# Patient Record
Sex: Female | Born: 1965 | Race: White | Hispanic: No | Marital: Married | Smoking: Current every day smoker
Health system: Southern US, Community
[De-identification: ages and names within clinical notes are randomized; demographics above are authoritative.]

## PROBLEM LIST (undated history)

## (undated) DIAGNOSIS — S82852A Displaced trimalleolar fracture of left lower leg, initial encounter for closed fracture: Secondary | ICD-10-CM

## (undated) DIAGNOSIS — F1120 Opioid dependence, uncomplicated: Secondary | ICD-10-CM

## (undated) DIAGNOSIS — M549 Dorsalgia, unspecified: Secondary | ICD-10-CM

## (undated) HISTORY — PX: SHOULDER SURGERY: SHX246

---

## 1997-08-07 ENCOUNTER — Inpatient Hospital Stay (HOSPITAL_COMMUNITY): Admission: AD | Admit: 1997-08-07 | Discharge: 1997-08-07 | Payer: Self-pay | Admitting: *Deleted

## 1997-09-24 ENCOUNTER — Inpatient Hospital Stay (HOSPITAL_COMMUNITY): Admission: AD | Admit: 1997-09-24 | Discharge: 1997-09-24 | Payer: Self-pay | Admitting: Obstetrics and Gynecology

## 1997-09-29 ENCOUNTER — Inpatient Hospital Stay (HOSPITAL_COMMUNITY): Admission: AD | Admit: 1997-09-29 | Discharge: 1997-09-29 | Payer: Self-pay | Admitting: Obstetrics and Gynecology

## 1997-10-30 ENCOUNTER — Inpatient Hospital Stay (HOSPITAL_COMMUNITY): Admission: AD | Admit: 1997-10-30 | Discharge: 1997-11-03 | Payer: Self-pay | Admitting: Obstetrics and Gynecology

## 1997-11-03 ENCOUNTER — Encounter (HOSPITAL_COMMUNITY): Admission: RE | Admit: 1997-11-03 | Discharge: 1998-01-18 | Payer: Self-pay | Admitting: *Deleted

## 1997-12-07 ENCOUNTER — Other Ambulatory Visit: Admission: RE | Admit: 1997-12-07 | Discharge: 1997-12-07 | Payer: Self-pay | Admitting: *Deleted

## 1998-01-02 ENCOUNTER — Encounter: Payer: Self-pay | Admitting: Orthopedic Surgery

## 1998-01-02 ENCOUNTER — Ambulatory Visit (HOSPITAL_COMMUNITY): Admission: RE | Admit: 1998-01-02 | Discharge: 1998-01-02 | Payer: Self-pay | Admitting: Orthopedic Surgery

## 1998-03-12 ENCOUNTER — Encounter: Payer: Self-pay | Admitting: Orthopedic Surgery

## 1998-03-12 ENCOUNTER — Ambulatory Visit (HOSPITAL_COMMUNITY): Admission: RE | Admit: 1998-03-12 | Discharge: 1998-03-12 | Payer: Self-pay | Admitting: Orthopedic Surgery

## 1998-07-04 ENCOUNTER — Encounter: Admission: RE | Admit: 1998-07-04 | Discharge: 1998-10-02 | Payer: Self-pay | Admitting: Anesthesiology

## 1998-07-09 ENCOUNTER — Encounter: Payer: Self-pay | Admitting: Anesthesiology

## 1998-08-07 ENCOUNTER — Encounter: Payer: Self-pay | Admitting: Orthopedic Surgery

## 1998-08-07 ENCOUNTER — Ambulatory Visit (HOSPITAL_COMMUNITY): Admission: RE | Admit: 1998-08-07 | Discharge: 1998-08-07 | Payer: Self-pay | Admitting: Orthopedic Surgery

## 1998-10-03 ENCOUNTER — Encounter: Admission: RE | Admit: 1998-10-03 | Discharge: 1998-12-13 | Payer: Self-pay | Admitting: Anesthesiology

## 1998-12-12 ENCOUNTER — Other Ambulatory Visit: Admission: RE | Admit: 1998-12-12 | Discharge: 1998-12-12 | Payer: Self-pay | Admitting: *Deleted

## 1998-12-13 ENCOUNTER — Encounter: Admission: RE | Admit: 1998-12-13 | Discharge: 1999-03-13 | Payer: Self-pay | Admitting: Anesthesiology

## 1999-04-09 ENCOUNTER — Encounter: Admission: RE | Admit: 1999-04-09 | Discharge: 1999-06-30 | Payer: Self-pay | Admitting: Anesthesiology

## 1999-08-11 ENCOUNTER — Encounter: Admission: RE | Admit: 1999-08-11 | Discharge: 1999-11-09 | Payer: Self-pay | Admitting: Anesthesiology

## 1999-11-20 ENCOUNTER — Encounter: Admission: RE | Admit: 1999-11-20 | Discharge: 2000-02-18 | Payer: Self-pay | Admitting: Anesthesiology

## 2000-03-01 ENCOUNTER — Encounter: Admission: RE | Admit: 2000-03-01 | Discharge: 2000-05-30 | Payer: Self-pay | Admitting: Anesthesiology

## 2000-03-29 ENCOUNTER — Ambulatory Visit (HOSPITAL_COMMUNITY): Admission: RE | Admit: 2000-03-29 | Discharge: 2000-03-29 | Payer: Self-pay | Admitting: Family Medicine

## 2000-03-29 ENCOUNTER — Encounter: Payer: Self-pay | Admitting: Family Medicine

## 2000-06-11 ENCOUNTER — Encounter: Admission: RE | Admit: 2000-06-11 | Discharge: 2000-09-09 | Payer: Self-pay | Admitting: Anesthesiology

## 2000-09-16 ENCOUNTER — Ambulatory Visit (HOSPITAL_COMMUNITY): Admission: RE | Admit: 2000-09-16 | Discharge: 2000-09-16 | Payer: Self-pay | Admitting: Anesthesiology

## 2000-09-16 ENCOUNTER — Encounter: Payer: Self-pay | Admitting: Anesthesiology

## 2000-09-27 ENCOUNTER — Encounter: Admission: RE | Admit: 2000-09-27 | Discharge: 2000-10-09 | Payer: Self-pay | Admitting: Anesthesiology

## 2001-01-28 ENCOUNTER — Ambulatory Visit (HOSPITAL_COMMUNITY): Admission: RE | Admit: 2001-01-28 | Discharge: 2001-01-28 | Payer: Self-pay | Admitting: Anesthesiology

## 2001-01-28 ENCOUNTER — Encounter: Payer: Self-pay | Admitting: Anesthesiology

## 2001-02-17 ENCOUNTER — Other Ambulatory Visit: Admission: RE | Admit: 2001-02-17 | Discharge: 2001-02-17 | Payer: Self-pay | Admitting: Obstetrics and Gynecology

## 2001-05-30 ENCOUNTER — Encounter: Admission: RE | Admit: 2001-05-30 | Discharge: 2001-05-30 | Payer: Self-pay | Admitting: Sports Medicine

## 2001-05-30 ENCOUNTER — Encounter: Payer: Self-pay | Admitting: Sports Medicine

## 2002-03-09 ENCOUNTER — Encounter: Payer: Self-pay | Admitting: Family Medicine

## 2002-03-09 ENCOUNTER — Encounter: Admission: RE | Admit: 2002-03-09 | Discharge: 2002-03-09 | Payer: Self-pay | Admitting: Family Medicine

## 2002-05-02 ENCOUNTER — Other Ambulatory Visit: Admission: RE | Admit: 2002-05-02 | Discharge: 2002-05-02 | Payer: Self-pay | Admitting: Obstetrics and Gynecology

## 2003-05-16 ENCOUNTER — Other Ambulatory Visit: Admission: RE | Admit: 2003-05-16 | Discharge: 2003-05-16 | Payer: Self-pay | Admitting: Obstetrics and Gynecology

## 2004-04-29 ENCOUNTER — Encounter: Admission: RE | Admit: 2004-04-29 | Discharge: 2004-04-29 | Payer: Self-pay | Admitting: Family Medicine

## 2004-05-27 ENCOUNTER — Other Ambulatory Visit: Admission: RE | Admit: 2004-05-27 | Discharge: 2004-05-27 | Payer: Self-pay | Admitting: *Deleted

## 2007-01-21 ENCOUNTER — Other Ambulatory Visit: Admission: RE | Admit: 2007-01-21 | Discharge: 2007-01-21 | Payer: Self-pay | Admitting: Obstetrics & Gynecology

## 2007-11-30 ENCOUNTER — Ambulatory Visit (HOSPITAL_BASED_OUTPATIENT_CLINIC_OR_DEPARTMENT_OTHER): Admission: RE | Admit: 2007-11-30 | Discharge: 2007-12-01 | Payer: Self-pay | Admitting: Orthopedic Surgery

## 2007-11-30 HISTORY — PX: ANTERIOR CRUCIATE LIGAMENT REPAIR: SHX115

## 2008-03-30 ENCOUNTER — Encounter: Admission: RE | Admit: 2008-03-30 | Discharge: 2008-03-30 | Payer: Self-pay | Admitting: Family Medicine

## 2008-05-25 ENCOUNTER — Emergency Department (HOSPITAL_COMMUNITY): Admission: EM | Admit: 2008-05-25 | Discharge: 2008-05-26 | Payer: Self-pay | Admitting: Emergency Medicine

## 2008-06-06 ENCOUNTER — Encounter: Admission: RE | Admit: 2008-06-06 | Discharge: 2008-06-06 | Payer: Self-pay | Admitting: Gastroenterology

## 2010-05-21 LAB — COMPREHENSIVE METABOLIC PANEL
Albumin: 3.4 g/dL — ABNORMAL LOW (ref 3.5–5.2)
BUN: 9 mg/dL (ref 6–23)
Calcium: 8.6 mg/dL (ref 8.4–10.5)
Creatinine, Ser: 0.77 mg/dL (ref 0.4–1.2)
Potassium: 3.9 mEq/L (ref 3.5–5.1)
Total Protein: 6.7 g/dL (ref 6.0–8.3)

## 2010-05-21 LAB — CBC
HCT: 39.1 % (ref 36.0–46.0)
MCHC: 35 g/dL (ref 30.0–36.0)
MCV: 92.8 fL (ref 78.0–100.0)
Platelets: 134 10*3/uL — ABNORMAL LOW (ref 150–400)
RDW: 13.1 % (ref 11.5–15.5)

## 2010-05-21 LAB — DIFFERENTIAL
Lymphocytes Relative: 9 % — ABNORMAL LOW (ref 12–46)
Lymphs Abs: 0.7 10*3/uL (ref 0.7–4.0)
Monocytes Absolute: 0.3 10*3/uL (ref 0.1–1.0)
Monocytes Relative: 4 % (ref 3–12)
Neutro Abs: 6.2 10*3/uL (ref 1.7–7.7)
Neutrophils Relative %: 86 % — ABNORMAL HIGH (ref 43–77)

## 2010-05-21 LAB — URINALYSIS, ROUTINE W REFLEX MICROSCOPIC
Nitrite: NEGATIVE
Specific Gravity, Urine: 1.04 — ABNORMAL HIGH (ref 1.005–1.030)
pH: 5.5 (ref 5.0–8.0)

## 2010-05-21 LAB — URINE MICROSCOPIC-ADD ON

## 2010-05-21 LAB — POCT PREGNANCY, URINE: Preg Test, Ur: NEGATIVE

## 2010-06-24 NOTE — Op Note (Signed)
NAMESEELEY, SOUTHGATE               ACCOUNT NO.:  [id number]   MEDICAL RECORD NO.:  [id number]          PATIENT TYPE:  AMB   LOCATION:  DSC                          FACILITY:  [facility]   PHYSICIAN:  Feliberto Gottron. Turner Daniels, M.D.   DATE OF BIRTH:  04-02-1965   DATE OF PROCEDURE:  11/30/2007  DATE OF DISCHARGE:                               OPERATIVE REPORT   PREOPERATIVE DIAGNOSIS:  Left knee anterior cruciate ligament tear.   POSTOPERATIVE DIAGNOSES:  Left knee anterior cruciate ligament tear with  the addition of some interim tearing of the lateral meniscus.   PROCEDURE:  Left knee partial arthroscopic lateral meniscectomy followed  by allograft anterior cruciate ligament reconstruction using an 11-mm  wide bone tendon bone allograft 8 x 20 femoral fixation screw 9 x 20  tibial fixation screw, both were Linvatec propel titanium designs.   SURGEON:  Feliberto Gottron. Turner Daniels, MD   FIRST ASSISTANT:  Shirl Harris, PA-C   ANESTHETIC:  General LMA plus femoral nerve block.   ESTIMATED BLOOD LOSS:  Minimal fluid replacement.   CRYSTALLOID:  1100 mL.   DRAINS:  Placed none.   TOURNIQUET TIME:  None.   INDICATIONS FOR PROCEDURE:  The patient is a 45 year old woman who was  dancing at [employer] party a couple of days ago, fell with a loud pop in  her knee.  When we saw her in the office 2 days ago, she had a  hemarthrosis, a small fleck of bone in the notch, positive Lachman's  test, positive pivot shift test, and it was felt that she probably  sustained an ACL tear at age 48.  Based on these findings, risks and  benefits of surgical reconstruction of the ACL were discussed with this  very active 45 year old woman who has some young children and she is  elected to undergo allograft ACL reconstruction.  Risks and benefits of  surgery discussed, questions were answered.   DESCRIPTION OF PROCEDURE:  The patient was identified by armband and  underwent left femoral nerve block anesthetic at [facility]  and then was transported to the operating room one where the appropriate  anesthetic monitors were reattached and general LMA anesthesia induced  with the patient in supine position.  Tourniquet applied high in the  left thigh.  Left lower extremity prepped and draped in sterile fashion  from the ankle to the tourniquet.  The tourniquet was never used.  She  did receive IV antibiotics preoperatively and at this point a standard  time out procedure was performed.  Upon the time out, we made standard  inferomedial and inferolateral parapatellar portals with a #11 blade  allowing introduction of the arthroscope through the inferolateral  portal and outflow through the inferomedial portal.  Pump pressure was  kept between about 80 mm for the beginning of the case and down to about  45-50 mm towards the end of the case.  Diagnostic arthroscopy confirmed  the hemarthrosis.  Normal trochlea patella and medial compartment moving  into the notch.  The ACL was noted to be completely torn off its femoral  origin and this was debrided back to the ends of the stumps proximally  and distally.  Moving to the lateral side, the patient had some minor  degenerative tearing of the lateral meniscus and this was debrided back  to stable margin as well.  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  At this point, the knee was placed in the Stulberg positioner at  45 degrees flexion.  The standard superolateral notch plasty performed  with the 4.5-hooded vortex bur.  Satisfied with the notch plasty, the  back table was then prepared and a 11-mm wide bone tendon bone allograft  with a 10-mm patellar bone plugged in an 11-mm tibial bone plug and a  single 20-gauge stand steel wire placed to a hole in the tibial bone  plug for traction.  At this point, the tibial 10 guide was placed at  about 58 degrees, inserted through the  inferomedial peripatellar portal  allowing [place] to check the position of the incision for the allograft  insertion and at this point, a 1.5 cm longitudinal incision was made, a  1.5 cm medial to the tibial tubercle.  This allowed placement of a guide  10 up through the pin positioning guide to the posterior aspect of the  ACL footprints and this was then over reamed within 11 mm badger reamer.  Utilizing the same tunnel, the 7 mm over-the-top guide was placed at the  1:30 position and a pin placed in the femoral notch in this position and  this was over reamed with a 10-mm badger reamer to a depth of about 32  mm.  A superior notch was then placed in the femoral tunnel and a  lateral notch in the tibial tunnel of the notch cutting guides.  At this  point, the allograft was then threaded up through the tibial tunnel into  the femoral tunnel using guide tends to push the allograft up in the  proper position and this was confirmed by arthroscopic imaging.  The  knee was then hyperflexed about 100 degrees and using a supplemental  inferomedial portal and Nitinol wire placed up into the notch between  the allograft in the femoral tunnel and then the allograft bone plug was  fixed with an 8 x 20 titanium Linvatec propel ACL screw.  We then  applied traction of the graft to confirm good firm fixation using the 20-  gauge wire.  The knee was then placed back into about 40 degrees of  flexion.  The graft rotated 90 degrees and a second Nitinol wire was  placed up between the tibial bone plug and the tibial tunnel.  We then  fixed the tibial bone plug to the tibial tunnel with a 9 x 20 Linvatec  propel titanium screw applying traction to the graft during the fixation  and a reverse Lachman's maneuver.  Excess bone plug was then removed  with an ACL saw and the wound irrigated out with normal saline solution.  Photographic documentation was made of the procedure using the  arthroscope.  The graft  insertion incision was then closed with 3-0  running Vicryl suture.  The skin itself was closed with 4-0 Monocryl  suture and dressing of Xeroform, 4x4 dressing sponges, Webril, an Ace  wrap and a cooling wrap and a knee immobilizer were placed on top of  this.  The patient was then awakened and taken to the  recovery room  without difficulty.      Feliberto Gottron. Turner Daniels, M.D.  Electronically Signed     FJR/MEDQ  D:  11/30/2007  T:  12/01/2007  Job:  [id number]

## 2010-06-27 NOTE — Consult Note (Signed)
[facility]  Patient:    Michele Sexton, Michele Sexton                      MRN: [id number] Proc. Date: 08/14/99 Adm. Date:  [id number] Attending:  Thyra Breed CC:         Georges Lynch. Darrelyn Hillock, M.D.                          Consultation Report  FOLLOW-UP EVALUATION  Michele Sexton comes in for follow-up evaluation of her lower back discomfort.  Since her last evaluation, she has had more problems with a broken tooth and she is back on the Percocet for this.  She continues on the OxyContin 20 mg 3 times a day for her lower back discomfort and notes that it reduces the pain to about 6/10.  She has had recurrence of a piriformis syndrome in her right buttock for which she has been through physical therapy in the past and she is asking about injections today.  She drove herself and I advised her that we would have to hold off until we got some assistance there.  She is quite blue today.  She is a little bit overwhelmed with moving recently and having to unpack boxes, going to the beach recently, and changes at work. We discussed the possibility of an antidepressant, but she wishes to hold at this time.  She is scheduled to see a dentist early next week.  PHYSICAL EXAMINATION:  VITAL SIGNS:  Blood pressure 129/62, heart rate 78, respiratory rate 16.  O2 saturation is 100%.  Pain level is 6/10 in her lower back, 7/10 in her hip. Temperature is 97.2.  NEUROLOGIC:  Deep tendon reflexes were symmetric.  Straight leg raise signs were negative.  She has good range of motion of her hips.  She does exhibit tenderness over the right piriformis muscle.  IMPRESSION:  1. Low back pain on the basis of lumbar degenerative disk disease and facet     joint arthritis.  2. Probable piriformis syndrome.  3. Cracked tooth per dentist.  4. The patient feels blue today.  DISPOSITION:  1. Continue on OxyContin 10 mg 2 p.o. in a.m., 2 at midday, and 1 in the     evening, #180, with no refill.  2. Continue Percocet 5/325 one p.o. q.6h. p.r.n., which she got a     prescription for two days ago.  3. Consider piriformis injection.  She will schedule this when she can get a     driver.  4. Follow up with me in four weeks otherwise. DD:  08/14/99 TD:  08/14/99 Job: [id number]

## 2010-06-27 NOTE — H&P (Signed)
[facility]  Patient:    Michele Sexton, Michele Sexton Visit Number: [id number] MRN: [id number]          Service Type: PMG Location: [facility]PC Attending Physician:  Thyra Breed Adm. Date:  [id number]   CC:         Georges Lynch. Darrelyn Hillock, M.D.   History and Physical  FOLLOWUP EVALUATION:  Mack comes in for followup evaluation of her chronic low back pain and piriformis syndrome.  Since her last evaluation, she has done remarkably well.  She has stopped walking as much as she was and had an injection at her last visit.  She has not continued on the Bextra but continues with the OxyContin 10 mg two tablets three times a day and is tolerating this well.  She has minimal complaints.  EXAMINATION:  VITAL SIGNS:  Blood pressure is 137/69.  Heart rate 74.  Respiratory rate 16. O2 saturation is 100%.  Pain level is 3/10.  NEUROLOGIC:  Straight leg raise signs are negative.  Deep tendon reflexes are symmetric.  Gait is intact.  IMPRESSION: 1. Low back pain on the basis of lumbar spondylosis and piriformis syndrome --    stable. 2. Other medical problems per primary care physician.  DISPOSITION: 1. Continue on OxyContin 10 mg 2 tablets p.o. q.8h., #180 with no refill. 2. Remain off of Bextra since she has not found this to be very helpful. 3. Consider getting into a swimming program to rehabilitate her hip muscles. 4. Follow up with me in eight weeks. DD:  09/27/00 TD:  09/27/00 Job: [id number]

## 2010-06-27 NOTE — H&P (Signed)
[facility]  Patient:    Michele Sexton, Michele Sexton                      MRN: [id number] Adm. Date:  [id number] Attending:  Thyra Breed CC:         Georges Lynch. Darrelyn Hillock, M.D.   History and Physical  HISTORY OF PRESENT ILLNESS:  Letta comes in for a followup evaluation of her chronic low-back pain on the basis of facette joint arthropathy with underlying lumbar spondylosis.  Since her last evaluation, she has been fairly stable with regard to this on her current regimen of OxyContin 10 mg two every eight hours.  She has required OxyIR for dental pain and she is in the process of making a decision with regard to having more dental work done.  She is tolerating the medications with minimal side effects.  She has had an MRI of her sinuses, which does demonstrate fluid levels in the maxillary sinuses and thickening in the sphenoid.  She had some septal deviation.  She had some questions about this.  PHYSICAL EXAMINATION:  VITAL SIGNS:  Blood pressure 114/77, heart rate 84, respiratory rate 18, O2 saturation 99%.  Pain level is 7/10.  NEUROLOGIC:  Hyperextension of her back increases her discomfort significantly.  Forward flexion reduces the discomfort.  Deep tendon reflexes are symmetric.  Gait is intact.  Motor is 5/5.  IMPRESSION: 1. Low-back pain on the basis of facette joint arthropathy with underlying    lumbar spondylosis--stable on current medical regimen. 2. Tooth pain with history of cracked teeth per dentist.  Currently managing    with some OxyIR, since she has not been in the position to go ahead and get    this taken care of. 3. Other medical problems per primary care physician.  DISPOSITION: 1. Continue on OxyIR 5 mg 1-2 p.o. q.6h. p.r.n. #100 with no refill. 2. OxyContin as per previously.  She got a prescription for this last week. 3. She was given the recipe for apple cider vinegar and honey and advised to    start on this. 4. Follow up with me  in 4-8 weeks.  She was encouraged to continue with her    exercise as outlined for piriform syndrome. DD:  04/19/00 TD:  04/20/00 Job: [id number]

## 2010-06-27 NOTE — Procedures (Signed)
[facility]  Patient:    Michele Sexton, Michele Sexton Visit Number: [id number] MRN: [id number]          Service Type: PMG Location: [facility] Attending Physician:  Thyra Breed Proc. Date: 08/26/00 Admit Date:  06/11/2000 Discharge Date: 09/09/2000   CC:         Estell Harpin, M.D.   Procedure Report  PROCEDURE:  Piriformis trigger point injection.  DIAGNOSIS:  Piriformis syndrome with underlying lumbar spondylosis.  INTERVAL HISTORY:  The patient has had progressively increasing hip discomfort since her last visit and was advised to let us know when she felt like it was at a point where she needed to go ahead and get an injection.  She has had positive responses to injections in the past.  Her medications are unchanged.  PHYSICAL EXAMINATION:  The patient is afebrile with vital signs stable. Please see flow sheet for details.  She exhibits tenderness over the right piriformis muscle.  Deep tendon reflexes were symmetric in the lower extremities.  DESCRIPTION OF PROCEDURE:  After informed consent was obtained, the patient was placed in the left lateral decubitus position with her left leg extended and her right flexed at the hip and knee.  I identified the greater trochanter and made a line from the greater trochanter to the posterior superior iliac spine.  A line bisect was drawn from the mid point of this to intersect a second line drawn from the greater trochanter to the sacral hiatus.  Pressure over this area elicited discomfort.  The area was prepped with Betadine x 3, and a skin wheal was raised with a 27 gauge needle using 1% lidocaine.  The 25 gauge needle was introduced down to approximate the piriformis muscle on the right side.  Aspiration was negative.  I injected 4 cc of local anesthetic. The patient was reevaluated in five minutes and noted that ongoing pain was occurring, so I reprepped the area and injected an additional 3 cc of  local anesthetic.  The local anesthetic consisted of 1% lidocaine mixed with 0.5% lidocaine mixed in a 1:1 ratio.  POSTPROCEDURE CONDITION:  Stable.  DISCHARGE INSTRUCTIONS: 1. Resume previous diet. 2. Limitations of activities per instruction sheet. 3. Continue on current medications. 4. Follow up with me on the 29th as previously scheduled.  I did have her    increase her Bextra to 20 mg per day. Attending Physician:  Thyra Breed DD:  08/26/00 TD:  08/27/00 Job: [id number]

## 2010-06-27 NOTE — Procedures (Signed)
[facility]  Patient:    Michele Sexton, Michele Sexton                         MRN: [id number] Proc. Date: 06/05/99 Attending:  Thyra Breed, M.D. CC:         Michele Sexton. Michele Sexton, M.D.                           Procedure Report  PROCEDURE:  Medial facet nerve block at L4-5 and L3-4 on the left side.  ANESTHESIOLOGIST:  Thyra Breed, M.D.  INTERVAL HISTORY:  Michele Sexton continues to have low back discomfort.  She is to a point now where she is entertaining possibility of having radiofrequency lesioning of her medial nerves.  I advised her that we needed to proceed with medial nerve blocks prior to this.  She is currently taking OxyContin 20 mg twice a day for control of her pain.  PHYSICAL EXAMINATION:  VITAL SIGNS:  Blood pressure 133/82, heart rate 84, respiratory rate 16, O2 saturation 100%, temperature 98.8.  BACK:  Pain level is 8/10.  She has tenderness over her left lower facet joints.  NEUROLOGIC:  Grossly intact.  PROCEDURE:  After informed consent was obtained, the patient was taken to the fluoroscopy suite where she was placed in the prone position and monitored. She had a pillow placed under her abdomen.  Using fluoroscopic guidance, I identified lumbar vertebrae L1 through L5 and marked the spinous processes. At 5 cm lateral to the rib line at the L3-4 and 4-5 area, skin marks were made such that a needle could be directed into the superior caudal aspect of the transverse process at the L3-4 and L4-5 spaces.  The area was prepped with Betadine x 3 and draped.  I anesthetized the skin with a 25-gauge needle using 2 cc of 1% lidocaine at each level, and 25-gauge spinal needles were introduced down to the superior caudal margins of the transverse processes where the SAP to the vertebrae.  Aspiration was negative for blood at each level.  Position was confirmed by oblique, AP, and lateral projections.  I injected 0.5 cc of 1% lidocaine at each level, and  there was no evidence of nerve root block over her spine.  Then 0.5 cc 1% lidocaine with 20 mg of Medrol was injected at each level and 0.5 cc of lidocaine was used to flush the needles which were removed intact.  POSTPROCEDURE CONDITION:  The patient was equivocal as to how much the injections helped immediately after the procedure.  I advised her that we needed to find out how she did.  If she does have a response,  we would need to consider radiofrequency lesioning if she did not get a sustained response from these injections.  DISPOSITION: 1. Resume previous diet. 2. Limitation of activities per instruction sheet. 3. Continue on current medications. 4. Follow up with me as previously scheduled. DD:  06/05/99 TD:  06/05/99 Job: [id number]

## 2010-06-27 NOTE — Procedures (Signed)
[facility]  Patient:    SARIYA TRICKEY                       MRN: [id number] Proc. Date: 09/09/99 Adm. Date:  [id number] Attending:  Thyra Breed CC:         Georges Lynch. Darrelyn Hillock, M.D.   Procedure Report  PREOPERATIVE DIAGNOSIS:  Piriformis syndrome.  POSTOPERATIVE DIAGNOSIS:  Piriformis syndrome.  PROCEDURE:  Trigger point injection of the piriformis muscle.  SURGEON:  Thyra Breed, M.D.  ANESTHESIA:  INTERVAL HISTORY:  The patient noted improvement with the injection although it is coming back somewhat.  She continues to do her stretches.  She notes that she is having some anterior hip discomfort as well.  She would like to go ahead and proceed with another injection, stating hold off on the physical therapy for the time being.  Her medications are unchanged for her lumbar spondylosis.  PHYSICAL EXAMINATION:  Blood pressure 129/67, heart rate 79, respiratory 10, and O2 saturations 98%.  Pain level was 8 out of 10.  Temperature is 98 degrees.  Her neuro exam is grossly unchanged.  DESCRIPTION OF PROCEDURE:  After informed consent was obtained, the patient was placed in the left lateral decubitus position with the left leg extended and ________ flex the hip and knee.  A line was drawn from the greater trochanter to the posterior-superior iliac spine, and at the midpoint of this, a line was drawn perpendicular to intersected second line from the greater trochanter to the sacral hiatus.  Pressure over this area _______ discomfort. The area was marked and prepped with Betadine x 3.  The skin was raised with a 27-gauge needle using 1% lidocaine.  A 25-gauge spinal needle was introduced down to the piriformis muscle.  Aspiration was negative.  I injected 4 cc of a combination of 1% lidocaine with 0.5% _______ bupivacaine and ratio of 1:1. The patient tolerated this well.  She noted marked improvement with regard to her discomfort.  POSTPROCEDURE  CONDITION:  Stable.  DISCHARGE INSTRUCTIONS: 1. Resume previous diet. 2. Limitation and activities per instruction sheet. 3. Continue on current medications. 4. Followup with me in two to three weeks.  She is to continue with the    stretching exercises and I have offered a referral to physical therapy of    which she wishes to hold off for the time being. DD:  09/09/99 TD:  09/10/99 Job: [id number]

## 2010-06-27 NOTE — H&P (Signed)
[facility]  Patient:    Michele Sexton, Michele Sexton                      MRN: [id number] Adm. Date:  [id number] Attending:  Thyra Breed CC:         Estell Harpin, M.D.   History and Physical  FOLLOWUP EVALUATION:  Corrisa comes in for followup evaluation of her low back pain in the basis of lumbar spondylosis.  Since her last evaluation, she has had her facial pain bother her to a much greater extent than her back discomfort.  She has also seen an orthopedic surgeon for her left knee and he has advocated surgery.  She has seen Dr. Margit Banda. Jearld Fenton with regard to her sinuses and he has advocated that she either have surgery on her sinuses versus going on allergy shots through Dr. Thad Ranger; she is thinking about this.  She denied any new neurologic symptoms.  She continues to have a lot of discomfort in her right hip region which she attributes to her left knee pain.  PHYSICAL EXAMINATION:  VITAL SIGNS:  Blood pressure is 141/77.  Heart rate is 92.  Respiratory rate 16.  O2 saturation is 100%.  Pain level is 10/10 in her head, 6/10 in her knee and 5/10 in her back.  EXTREMITIES:  Deep tendon reflexes were symmetric.  Straight leg raise signs were negative.  She is tender over her right piriformis muscle.  CURRENT MEDICATIONS: 1. OxyContin 10 mg two p.o. q.8h. 2. Allegra-D. 3. Guaifenesin. 4. Tylenol.  IMPRESSION: 1. Low back pain on the basis of lumbar spondylosis. 2. Facial pain on the basis of recurrent allergic rhinitis. 3. Left knee pain on the basis of osteoarthritis per Dr. Estell Harpin. 4. Other medical problems per her primary care physician.   DISPOSITION: 1. Continue on current medications, with prescriptions written for OxyContin    10 mg 2 p.o. q.8h., #180 with no refill. 2. Follow up with me in eight weeks or sooner if she needs another injection    into her piriformis region; she will call us on this. DD:  08/09/00 TD:   08/09/00 Job: [id number]

## 2010-06-27 NOTE — Procedures (Signed)
[facility]  Patient:    Michele Sexton, Michele Sexton                      MRN: [id number] Proc. Date: 08/28/99 Adm. Date:  [id number] Attending:  Thyra Breed CC:         Georges Lynch. Darrelyn Hillock, M.D.                           Procedure Report  PROCEDURE:  Piriformis injection on the right side.  DIAGNOSIS:  Sciatic with piriformis muscle irritation.  INTERVAL HISTORY:  The patient presented for injections. She continues to have a lot of discomfort in her right SI joint area despite the exercises I gave her for piriformis syndrome. She had been diagnosed with this previously by Dr. Darrelyn Hillock.  CURRENT MEDICATIONS:  Unchanged.  PHYSICAL EXAMINATION:  VITAL SIGNS:  Blood pressure 127/82, heart rate 72, respiratory rate 16, O2 saturations 97%, and temperature is 97.9 and pain level is 8/10.  NEUROLOGIC:  Grossly unchanged with symmetric deep tendon reflexes and negative straight leg raise signs. She has good range of motion of her hips.  DESCRIPTION OF PROCEDURE:  After informed consent was obtained, the patient was placed in the left lateral decubitus position with the left leg extended with right flexion of the hip and knee. A line was drawn from the greater trochanter down to the posterior superior iliac spine. At the mid point of this line, a perpendicular line was drawn to intersect the line from the greater trochanter to the sacral hiatus. Pressure over this area elicited discomfort. The area was prepped with Betadine x 3. A skin wheal was raised with a 27 gauge needle using 1% lidocaine. A 25 gauge spinal needle was introduced to the piriformis muscle. Aspiration was negative. I injected 5 cc of 1% lidocaine. The needle was removed intact.  CONDITION POST PROCEDURE:  Stable with marked decrease in pain.  DISPOSITION: 1. Resume previous diet. 2. Limitations in activities per instruction sheet. 3. Continue on current medications with a prescription written  for Percocet    5/325 1 to 2 p.o. q. 4-6h p.r.n. breakthrough pain #100 with no refill. 4. Followup with me in next week. She is to continue with the exercises I    have outlined. If she is not improving next week, we will repeat the    injection and refer her for physical therapy.    DD:  08/28/99 TD:  09/01/99 Job: [id number]

## 2010-06-27 NOTE — Procedures (Signed)
[facility]  Patient:    Michele Sexton, Michele Sexton                      MRN: [id number] Proc. Date: 09/06/00 Adm. Date:  [id number] Attending:  Thyra Breed CC:         Georges Lynch. Darrelyn Hillock, M.D.   Procedure Report  PROCEDURE:  Trigger point injection of the right piriformis muscle and over the proximal sartorius on the right side.  DIAGNOSES:  Lumbar degenerative disk disease and piriformis syndrome as well as right hip pain.  INTERVAL HISTORY:  The patient noted that her last injection was of minimal benefit in reducing her discomfort.  She is asking about going ahead and getting another injection today but notes that her anterior hip is bothering her to a very significant extent.  I identified the piriformis muscle as one of her sources of pain and the proximal aspects of her sartorius.  These areas were marked.  Her neuro exam is otherwise unchanged.  PHYSICAL EXAMINATION:  Blood pressure 128/79, heart rate 82, respiratory rate 16, O2 saturations 98%.  Pain level is 8.  She exhibits tenderness, as noted above.  Neuro exam is grossly intact.  DESCRIPTION OF PROCEDURE:  The areas marked were prepped with Betadine x 3. The patient was placed in the right lateral decubitus position initially.  Her right leg was flexed and left extended.  I identified the piriformis muscle, prepped out the skin with Betadine, and injected the skin with a 27 gauge needle using 1% lidocaine.  A 25 gauge spinal needle was introduced down to approximate the piriformis muscle, where I injected 4 cc of local anesthetic. The patient was placed in the supine position, and I identified the sartorius muscle trigger point, prepped out the area with Betadine x 3, anesthetized the skin with a 27 gauge needle using 1% lidocaine, and injected 1.5 cc of local anesthetic into the trigger point.  The local anesthetic used was 0.5% levobupivacaine mixed with 1% lidocaine in a 1:1 mixture.  The  patient tolerated the procedure well and did note attenuation in her discomfort.  POSTPROCEDURE CONDITION:  Stable.  DISCHARGE INSTRUCTIONS: 1. Resume previous diet. 2. Limitations and activities per instruction sheet. 3. Continue on current medications. 4. Follow up with me in three weeks.  I have written a prescription for    OxyContin 10 mg 2 p.o. q.8h. #180 and a prescription to go ahead and repeat    her lumbar spine MRI to assess whether there has been any progression in    her disease. DD:  09/06/00 TD:  09/06/00 Job: [id number]

## 2010-06-27 NOTE — H&P (Signed)
[facility]  Patient:    Michele Sexton, Michele Sexton                      MRN: [id number] Adm. Date:  [id number] Attending:  Thyra Breed CC:         Elana Alm. Eliezer Lofts., M.D.   History and Physical  FOLLOW-UP EVALUATION:  Michele Sexton comes in for follow-up evaluation today. She is very stressed out. She has developed a right maxillary sinusitis which she has had in the past. She has identical symptoms. She feels worn out. She feels frustrated. Her mother continues to do poorly with her Alzheimers. She is scheduled to see Dr. Riccardo Dubin at [facility] in January.  She is very overwhelmed with Christmas.  Her right upper teeth are bothering her rather profoundly, and her lower back is bothering her because she is moving boxes around.  PHYSICAL EXAMINATION:  VITAL SIGNS:  Blood pressure is 137/83, heart rate is 110, respiratory rate 20, O2 saturation is 99%, temperature is 98.6.  NEUROLOGICAL:  She is tender over her right maxillary sinus, as well as over her right molar teeth. Neurologic exam is grossly unchanged otherwise.  IMPRESSION: 1. Right maxillary sinusitis with associated tooth pain secondary to this with    underlying dental problems. 2. Low back pain on the basis lumbar spondylosis. 3. Other medical problems per Molly Maduro A. Eliezer Lofts., M.D.  DISPOSITION: 1. Augmentin 500 mg one p.o. b.i.d., #20. 2. Continue with oxycodone 5 mg one p.o. to two p.o. q.6h. p.r.n., #100 with    no refill. 3. Continue with the OxyContin. 4. Follow up with me as previously scheduled. 5. She plans to get Diflucan for her yeast infection that she typically gets    with the antibiotics. DD:  01/21/00 TD:  01/21/00 Job: [id number]

## 2010-06-27 NOTE — H&P (Signed)
[facility]  Patient:    Michele Sexton, Michele Sexton                      MRN: [id number] Adm. Date:  [id number] Attending:  Thyra Breed CC:         Elana Alm. Eliezer Lofts., M.D.  Ronald A. Darrelyn Hillock, M.D.   History and Physical  FOLLOWUP EVALUATION:  [name] comes in for followup evaluation of her chronic low back pain on the basis of facet joint arthropathy.  Since her last evaluation, the patient had a bout of sinusitis and strep pneumonia.  She has been treated with antibiotics for this.  She noted that it did exacerbate her tooth discomfort, which seems to be improving overall.  She states that her back pain is fairly stable.  She has continued to use the oxycodone for her tooth discomfort, which comes and goes.  She feels as though this is starting to improve somewhat.  Her back is really unchanged from previously.  Her mother continues to have some memory deficits, which has worried her greatly.  EXAMINATION  VITAL SIGNS:  Blood pressure is 154/84, heart rate is 106, respiratory rate is 14, O2 saturation is 98%, pain level is 5/10 and temperature is 97.7.  NEUROLOGIC:  Straight leg raise signs are negative.  Deep tendon reflexes are symmetric.  BACK:  She has increased pain on hyperextension of her back with reduced pain on forward flexion.  IMPRESSION 1. Low back pain on the basis of lumbar facet joint arthropathy and underlying    lumbar spondylosis. 2. Tooth pain secondary to dental problems. 3. Other medical problems per Dr. Elana Alm. Eliezer Lofts., her primary care    physician.  DISPOSITION 1. Continue on the OxyContin 10 mg 2 p.o. q.8h., #180 with no refill. 2. Continue with the oxycodone 5 mg 1 to 2 p.o. q.6h. p.r.n., #100 with no    refill. 3. Follow up with me in four to eight weeks. 4. Continue with exercises for the piriformis syndrome. DD:  01/05/00 TD:  01/05/00 Job: [id number]

## 2010-06-27 NOTE — Procedures (Signed)
[facility]  Patient:    Michele Sexton, Michele Sexton                      MRN: [id number] Proc. Date: 02/11/00 Adm. Date:  [id number] Attending:  Thyra Breed CC:         Elana Alm. Eliezer Lofts., M.D.   Procedure Report  PROCEDURE:  Piriformis trigger point injection.  DIAGNOSIS:  Piriformis syndrome with underlying lumbar spondylosis.  INTERVAL HISTORY:  The patient was seen last week, and we discussed going ahead and getting a trigger point injection.  She was taking care of her kids and could not make arrangements for a ride, so she comes in today to go ahead and get this done.  Her medications are unchanged.  PHYSICAL EXAMINATION:  VITAL SIGNS:  Blood pressure 89/39, heart rate 91, respiratory rate 20, O2 saturation 98%, pain level is 4 out of 10.  MUSCULOSKELETAL:  Her exam is unchanged from last week.  DESCRIPTION OF PROCEDURE:  After informed consent was obtained, the patient was placed in the left lateral decubitus position with her left leg extended and right flexed at the hip and knee.  A line was drawn from the greater trochanter to the posterior superior iliac spine.  A second line from the greater trochanter to the sacral hiatus was identified.  At the midpoint of the first line, a perpendicular line was drawn to intersect the second line. Pressure over this intersection resulted in marked increased discomfort.  The area was prepped with Betadine x 3.  A skin wheal was raised with 1% lidocaine using a 27-gauge needle.  A 25-gauge spinal needle was introduced down to approximate the piriformis muscle.  Aspiration was negative for blood.  I injected 3 cc of a mixture of local anesthetic which consisted of 1% lidocaine to 0.5% levobupivacaine in a ratio of 1:1.  Fifteen minutes later, the patient noted marked attenuation in her discomfort.  DISPOSITION: 1. Resume previous diet. 2. Limitation of activities per instruction sheet as outlined by my  assistant    today. 3. Continue current medications.  We will be writing for the OxyContin at    40 mg when it runs out in a couple of weeks for at least a weeks worth. 4. Follow up with me in six weeks.  She knows she needs to swing by in about    two weeks for another prescription for OxyContin. DD:  02/11/00 TD:  02/11/00 Job: [id number]

## 2010-06-27 NOTE — Procedures (Signed)
[facility]  Patient:    Michele Sexton, Michele Sexton                      MRN: [id number] Proc. Date: 10/08/99 Adm. Date:  [id number] Attending:  Thyra Breed CC:         Georges Lynch. Darrelyn Hillock, M.D.   Procedure Report  PROCEDURE:  Piriformis trigger point injection on the right side.  DIAGNOSIS:  Piriformis syndrome and underlying lumbar spondylosis.  HISTORY OF PRESENT ILLNESS:  The patient noted marked improvement after her last injection and only has a twinge of discomfort today but is beginning to come back. She has been doing the exercises that I outlined to her and she does feel as though these are very helpful. Her biggest complaint in addition to her buttock pain is ongoing stiffness in her back and morning stiffness which requires heat from the shower to get loosened up. She is also having ongoing problems with her mouth but current does not have the finances to afford repair at this time.  PHYSICAL EXAMINATION:  VITAL SIGNS:  Blood pressure 113/64, heart rate 87, respiratory rate 16, O2 saturations 97%, temperature is 97.8 and pain level is 4/10.  EXTREMITIES:  Deep tendon reflexes were symmetric in the knee and ankle. Straight leg raise signs were negative. She does have increased pain on internal rotation of the right leg.  DESCRIPTION OF PROCEDURE:  After informed consent was obtained, the patient was placed in the left lateral decubitus position. Her left leg was extended and her right flexed at the hip and knee. A line was drawn from the greater trochanter to the posterior superior iliac spine and a second line from the greater trochanter to the cecal hiatus. The mid point of line 1 was identified and perpendicular line was drawn to intersect the second line. Pressure over this area elicited discomfort. The area was marked and prepped with Betadine x 3. Using a 27 gauge needle, I anesthetized the skin and subcutaneous structures. A 25 gauge  spinal needle was introduced down to the piriformis muscles. Aspiration was negative for blood. I injected 4 cc of local anesthetic which consisted of 3 cc of 1% lidocaine with 3 cc of 0.5% levobupivacaine.  The needle was removed intact.  Fifteen minutes later, the patient noted marked attenuation in her discomfort.  DISPOSITION: 1. Resume previous diet. 2. Limitations in activities per instruction sheet. 3. Continue on current medications. 4. Followup with me in 6 weeks. At that time, I will consider sending her    for a repeat MRI versus plain films of her back. We have tried nerve    blocks to the facet joints and intra-articular facet injections with poor    result. DD:  10/08/99 TD:  10/09/99 Job: [id number]

## 2010-06-27 NOTE — H&P (Signed)
[facility]  Patient:    Michele Sexton, Michele Sexton                      MRN: [id number] Adm. Date:  [id number] Attending:  Thyra Breed CC:         Georges Lynch. Darrelyn Hillock, M.D.  Dr. Azucena Kuba, [facility]   History and Physical  FOLLOWUP EVALUATION  Herma comes in for followup evaluation of her chronic low back pain on the basis of facet joint arthritis and recurrent mouth pain with history of dental problems.  Since her last evaluation, she has developed an upper respiratory tract infection and is recovering from this today.  She notes some swelling of her left upper extremity, but has contacted her primary care physician with regard to this.  She has noted that the injections into the pyriformis have been very successful in reducing her discomfort in that region.  She continues to have lower back discomfort.  She rates her pain today at a level of 6 out of 10.  She continues to have mouth discomfort.  She is somewhat upset over recently discovering that her mother has early Alzheimers and is trying to cope with this.  PHYSICAL EXAMINATION:  VITAL SIGNS:  Blood pressure 130/89, heart rate 89, respiratory rate 16, O2 saturation 99%, pain level 6/10, temperature 99.5.  NEUROLOGIC:  Deep tendon reflexes are symmetric in lower extremities. Straight leg raise signs are negative.  She has some mild swelling over left upper extremity.  IMPRESSION: 1. Low back pain on the basis of lumbar spondylosis and history of pyriformis    syndrome, markedly improved. 2. Other medical problems per primary care physician.  DISPOSITION: 1. Continue on her OxyContin and Percocet as previously. 2. Follow up with me in eight weeks. 3. We will hold off on the injections today. DD:  11/20/99 TD:  11/20/99 Job: [id number]

## 2010-06-27 NOTE — H&P (Signed)
[facility]  Patient:    MAILE, LINFORD                      MRN: [id number] Adm. Date:  [id number] Attending:  Thyra Breed CC:         Estell Harpin, M.D.   History and Physical  FOLLOWUP EVALUATION  HISTORY OF PRESENT ILLNESS:  Judieth comes in for follow-up evaluation of her chronic low back pain on the basis of lumbar spondylosis and tooth pain on the basis of periodontal problems.  Since her last visit, she has not been back to the dentist so she continues to have a fair amount of tooth pain.  She is taking less of the OxyContin and has not gotten a prescription filled in a month.  She continues on the OxyContin 10 mg 2 tablets q.8h. for her back.  She has developed problems with her left knee and saw Dr. Farris Has for this who has gotten an MRI, which the patient reported as being abnormal along the medial meniscal region.  She had an injection last week and does not feel as though it has been of any benefit.  PHYSICAL EXAMINATION:  VITAL SIGNS:  Blood pressure 134/71, heart rate 74, respiratory rate 16, and O2 saturation 100%.  Pain level is 5/10.  NEUROLOGIC:  She demonstrates symmetric deep tendon reflexes with negative straight leg raise signs.  She is tender over the medial aspect of her left knee.  IMPRESSION: 1. Lumbar pain on the basis of lumbar spondylosis. 2. Tooth pain with a history of cracked tooth. 3. Left knee pain per Dr. Farris Has. 4. Other medical problems per her primary care physician.  DISPOSITION: 1. Continue current medications with prescription written for oxycodone 5 mg    1-2 p.o. q.6-8h. p.r.n., #100 with no refill. 2. Followup with me in eight weeks.  She is getting some discomfort over her    buttock region where we have given her the piriformis injections before,    and I advised her that if this gets worse that we would consider    reinjecting this, but because it is so close to her knee injection, we  would hold off for the time being. DD:  06/14/00 TD:  06/14/00 Job: [id number]

## 2010-06-27 NOTE — Procedures (Signed)
[place]  Patient:    Michele Sexton, Michele Sexton                        MRN: [id number] Proc. Date: 06/10/99 Attending:  Thyra Breed, M.D. CC:         Georges Lynch. Darrelyn Hillock, M.D.                           Procedure Report  [name] has not noted any improvement from her medial nerve blocks to the facet joint.  She has noted more of a burning discomfort four days post injection in the left lower lumbosacral region.  She has no new neurologic symptoms.  PHYSICAL EXAMINATION:  Patient afebrile with vital signs stable.  Her blood pressure was 137/71, heart rate 96, respiratory rate 16, oxygen saturations 100%.  Temperature 97.7.  The patient demonstrated no erythema of her back. Deep tendon reflexes are symmetric.  Straight leg raising signs were negative. She has tenderness in the lower left lumbar facet regions which are exacerbated by hyperextension and rotation to the left.  IMPRESSION: 1. Lumbar facet joint arthritis with no appreciable improvement after four    days from her medial nerve blocks.  DISPOSITION: 1. Continue with current medical management.  She is to see me back    in two weeks. DD:  06/10/99 TD:  06/12/99 Job: [id number]

## 2010-06-27 NOTE — Op Note (Signed)
[facility]  Patient:    Michele Sexton, Michele Sexton Visit Number: [id number] MRN: [id number]          Service Type: PMG Location: [facility] Attending Physician:  Thyra Breed Proc. Date: 03/02/00 Admit Date:  03/01/2000   CC:         Windy Fast A. Darrelyn Hillock, M.D.   Operative Report  PROCEDURE:  Trigger point injection of the piriformis muscle.  ANESTHESIOLOGIST:  Thyra Breed, M.D.  DIAGNOSIS:  Exacerbation of piriformis syndrome with underlying lumbar spondylosis.  INTERVAL HISTORY: The patient has noted that her buttock pain has returned, and she is interested in another injection today.  She has noted that taking the OxyContin at 40 mg twice a day is too much, and she would like to cut it back to the 20 mg 3 times a day even though she is aware that she may end up having problems with edema.  PHYSICAL EXAMINATION:  VITAL SIGNS:  The patient is afebrile with vital signs stable.  Please see flow sheet.  NEUROLOGIC:  She exhibited no change in her neurologic exam.  She continues to have pain in her right piriformis region.  DESCRIPTION OF PROCEDURE:  After informed consent was obtained, the patient was placed in the left lateral decubitus position with her left leg extended and her right flexed at the hip and knee.  I identified the greater trochanter and the posterior superior iliac spine and drew a line from these two points. At the mid point of that line, a perpendicular line was drawn down to intersect the line from the greater trochanter to the sacral hiatus.  At the intersection, pressure was applied which elicited her discomfort.   The area was marked and prepped with Betadine x 3.  I anesthetized the skin with a a 27-gauge needle using 1% lidocaine.  A 25-gauge spinal needle was introduced down to approximate the piriforms muscle, and I injected 4 cc of 0.5% levobupivacaine with 1% lidocaine in a 1:1 ratio.  The patient noted that her pain was  improved.  POSTPROCEDURE CONDITION:  Stable with no weakness in her right lower extremity.  DISCHARGE INSTRUCTIONS: 1. Resume previous diet. 2. Limitation of activities per instruction sheet. 3. Continue on current medications with exception of discontinuation of the    OxyContin 40 mg twice a day and starting OxyContin 20 mg 1 p.o. q.8h., #60    with no refills. 4. Keep scheduled appointment in a few weeks. Attending Physician:  Thyra Breed DD:  03/02/00 TD:  03/02/00 Job: [id number]

## 2010-06-27 NOTE — H&P (Signed)
[facility]  Patient:    Michele Sexton, WINBUSH                      MRN: [id number] Adm. Date:  [id number] Attending:  Thyra Breed CC:         Elana Alm. Eliezer Lofts., M.D.                         History and Physical  FOLLOWUP EVALUATION:  Aurther Loft comes in for followup evaluation today.  Her right sinus is much less tender and she is not having as much problems with fevers. She has tolerated Christmas well but did exacerbate her right piriformis discomfort wrapping present.  Unfortunately, she does not have a driver today so we cannot inject this.  She states that she continues to take the OxyContin 20 mg q.h.s. with OxyIR 10 mg four times a day.  She rates her pain at about 6/10.  PHYSICAL EXAMINATION  VITAL SIGNS:  Blood pressure 126/79, heart rate is 81, respiratory rate is 16 and O2 saturation is 98%.  NEUROLOGIC:  Her neuro exam reveals deep tendon reflexes symmetric in the lower extremities.  Straight leg raise signs negative.  EXTREMITIES:  She has increased right hip discomfort on gentle rotation of her right leg.  BACK:  Hyperextension of her back markedly increases her discomfort at 10 to 15 degrees.  IMPRESSION 1. Lumbar spondylosis with ongoing discomfort despite OxyContin. 2. Flare-up of right piriformis syndrome. 3. Maxillary sinusitis, improving. 4. Dental pain from cracked teeth. 5. Other medical problems per Dr. Elana Alm. Eliezer Lofts.  DISPOSITION 1. I advised the patient that we needed to go ahead and consolidate her    OxyContin at her next visit to 40 mg q.12h.; this will allow her to take    less of the OxyIR. 2. She cannot tolerate being cycled over to morphine or methadone because of    side-effects, the former causing blistering of the mouth and the latter    causing itching. 3. I will go ahead and see the patient back in followup for a piriformis    injection when we have an available time for this.  She also needs to take   her son to the doctor later today, so it is not really a good day for her    to get an injection. DD:  02/06/00 TD:  02/06/00 Job: [id number]

## 2010-11-11 LAB — POCT HEMOGLOBIN-HEMACUE: Hemoglobin: 14.3

## 2016-06-21 ENCOUNTER — Emergency Department (HOSPITAL_BASED_OUTPATIENT_CLINIC_OR_DEPARTMENT_OTHER)
Admission: EM | Admit: 2016-06-21 | Discharge: 2016-06-21 | Disposition: A | Discharge status: Home / Self Care | Payer: BLUE CROSS/BLUE SHIELD | Attending: Emergency Medicine | Admitting: Emergency Medicine

## 2016-06-21 ENCOUNTER — Emergency Department (HOSPITAL_BASED_OUTPATIENT_CLINIC_OR_DEPARTMENT_OTHER): Payer: BLUE CROSS/BLUE SHIELD

## 2016-06-21 ENCOUNTER — Encounter (HOSPITAL_BASED_OUTPATIENT_CLINIC_OR_DEPARTMENT_OTHER): Payer: Self-pay | Admitting: *Deleted

## 2016-06-21 DIAGNOSIS — Y939 Activity, unspecified: Secondary | ICD-10-CM | POA: Diagnosis not present

## 2016-06-21 DIAGNOSIS — Y999 Unspecified external cause status: Secondary | ICD-10-CM | POA: Insufficient documentation

## 2016-06-21 DIAGNOSIS — S82852A Displaced trimalleolar fracture of left lower leg, initial encounter for closed fracture: Secondary | ICD-10-CM | POA: Diagnosis not present

## 2016-06-21 DIAGNOSIS — Y929 Unspecified place or not applicable: Secondary | ICD-10-CM | POA: Diagnosis not present

## 2016-06-21 DIAGNOSIS — F17210 Nicotine dependence, cigarettes, uncomplicated: Secondary | ICD-10-CM | POA: Insufficient documentation

## 2016-06-21 DIAGNOSIS — S90912A Unspecified superficial injury of left ankle, initial encounter: Secondary | ICD-10-CM | POA: Diagnosis present

## 2016-06-21 DIAGNOSIS — W010XXA Fall on same level from slipping, tripping and stumbling without subsequent striking against object, initial encounter: Secondary | ICD-10-CM | POA: Insufficient documentation

## 2016-06-21 MED ORDER — OXYCODONE-ACETAMINOPHEN 5-325 MG PO TABS: 1.0000 | ORAL_TABLET | ORAL | 0 refills | Status: DC | PRN

## 2016-06-21 MED ORDER — FENTANYL CITRATE (PF) 100 MCG/2ML IJ SOLN
50.0000 ug | Freq: Once | INTRAMUSCULAR | Status: AC
  Administered 2016-06-21: 50 ug via INTRAVENOUS
  Filled 2016-06-21: qty 2

## 2016-06-21 MED ORDER — IBUPROFEN 600 MG PO TABS: 600.0000 mg | ORAL_TABLET | Freq: Four times a day (QID) | ORAL | 0 refills | Status: DC | PRN

## 2016-06-21 MED ORDER — ONDANSETRON HCL 4 MG/2ML IJ SOLN
4.0000 mg | Freq: Once | INTRAMUSCULAR | Status: AC
  Administered 2016-06-21: 4 mg via INTRAVENOUS
  Filled 2016-06-21: qty 2

## 2016-06-21 NOTE — ED Provider Notes (Signed)
[facility] Provider Note   CSN: [id number] Arrival date & time: 06/21/16  1940  By signing my name below, I, Rosario AdieWilliam Andrew Hiatt, attest that this documentation has been prepared under the direction and in the presence of Rolan BuccoBelfi, Ayo Smoak, MD. Electronically Signed: Rosario AdieWilliam Andrew Hiatt, ED Scribe. 06/21/16. 8:10 PM.  History   Chief Complaint Chief Complaint  Patient presents with  . Ankle Pain   The history is provided by the patient. No language interpreter was used.    HPI Comments: Michele Sexton H Masur is a 51 y.o. female with no pertinent PMHx, who presents to the Emergency Department complaining of sudden onset, worsening left ankle pain beginning prior to arrival. Per pt, she walked on gravel this afternoon and fell off the edge of her shoe, causing her to roll her left ankle. She states she heard a sudden "popping" sensation with this action. Pt reports associated swelling to the ankle since the incident. Her pain is worse with weight bearing to the extremity. No noted treatments for her pain were tried prior to coming into the ED. She denies any numbness, weakness, or any other associated symptoms.   She did not hit her head and denies other injuries.  No neck or back pain.  History reviewed. No pertinent past medical history.  There are no active problems to display for this patient.  History reviewed. No pertinent surgical history.  OB History    No data available     Home Medications    Prior to Admission medications   Medication Sig Start Date End Date Taking? Authorizing Provider  ibuprofen (ADVIL,MOTRIN) 600 MG tablet Take 1 tablet (600 mg total) by mouth every 6 (six) hours as needed. 06/21/16   Rolan BuccoBelfi, Tearia Gibbs, MD  oxyCODONE-acetaminophen (PERCOCET) 5-325 MG tablet Take 1-2 tablets by mouth every 4 (four) hours as needed. 06/21/16   Rolan BuccoBelfi, Kostas Marrow, MD   Family History History reviewed. No pertinent family history.  Social History Social History    Substance Use Topics  . Smoking status: Current Every Day Smoker    Packs/day: 0.50    Types: Cigarettes  . Smokeless tobacco: Never Used  . Alcohol use No   Allergies   Patient has no known allergies.  Review of Systems Review of Systems  Constitutional: Negative for activity change, appetite change and fever.  HENT: Negative for dental problem, nosebleeds and trouble swallowing.   Eyes: Negative for pain and visual disturbance.  Respiratory: Negative for shortness of breath.   Cardiovascular: Negative for chest pain.  Gastrointestinal: Negative for abdominal pain, nausea and vomiting.  Genitourinary: Negative for dysuria and hematuria.  Musculoskeletal: Positive for arthralgias, joint swelling and myalgias. Negative for back pain and neck pain.  Skin: Negative for wound.  Neurological: Negative for weakness, numbness and headaches.  Psychiatric/Behavioral: Negative for confusion.  All other systems reviewed and are negative.  Physical Exam Updated Vital Signs BP (!) 148/66   Pulse 80   Temp 99.1 F (37.3 C) (Oral)   Resp 20   Ht 5\' 6"  (1.676 m)   Wt 160 lb (72.6 kg)   LMP 06/21/2016 (Within Days)   SpO2 96%   BMI 25.82 kg/m   Physical Exam  Constitutional: She is oriented to person, place, and time. She appears well-developed and well-nourished.  HENT:  Head: Normocephalic and atraumatic.  Nose: Nose normal.  No hemotympanum  Eyes: Conjunctivae are normal. Pupils are equal, round, and reactive to light.  Neck:  No pain to the cervical, thoracic,  or LS spine.  No step-offs or deformities noted  Cardiovascular: Normal rate and regular rhythm.   No murmur heard. Pulmonary/Chest: Effort normal and breath sounds normal. No respiratory distress. She has no wheezes. She exhibits no tenderness.  Abdominal: Soft. Bowel sounds are normal. She exhibits no distension. There is no tenderness.  Musculoskeletal: Normal range of motion. She exhibits tenderness.  Positive  swelling over the lateral malleolus of the left ankle. Tenderness to the lateral and medial malleolus. No pain to the foot. No pain to the proximal fibula or knee. NVI. No wounds. Pedal pulses are intact. She has swelling around the ankle joint but no significant swelling to the lower leg or calf area. Compartments are soft.  Neurological: She is alert and oriented to person, place, and time.  Skin: Skin is warm and dry. Capillary refill takes less than 2 seconds.  Psychiatric: She has a normal mood and affect.  Vitals reviewed.  ED Treatments / Results  DIAGNOSTIC STUDIES: Oxygen Saturation is 96% on RA, normal by my interpretation.   COORDINATION OF CARE: 8:10 PM-Discussed next steps with pt. Pt verbalized understanding and is agreeable with the plan.   Labs (all labs ordered are listed, but only abnormal results are displayed) Labs Reviewed - No data to display  EKG  EKG Interpretation None      Radiology Dg Ankle Complete Left  Result Date: 06/21/2016 CLINICAL DATA:  Fall, left ankle pain EXAM: LEFT ANKLE COMPLETE - 3+ VIEW COMPARISON:  None. FINDINGS: Nondisplaced distal fibular fracture, mildly comminuted. Nondisplaced transverse medial malleolar fracture. Oblique fracture extending to the medial tibial plafond on the frontal radiograph, corresponding to a minimally displaced posterior malleolar fracture on the lateral view. Ankle mortise is preserved. Moderate soft tissue swelling, most prominent laterally. IMPRESSION: Trimalleolar fracture, as above. Electronically Signed   By: Charline Bills M.D.   On: 06/21/2016 20:19   Ct Ankle Left Wo Contrast  Result Date: 06/21/2016 CLINICAL DATA:  Ankle fracture after fall this evening. EXAM: CT OF THE LEFT ANKLE WITHOUT CONTRAST TECHNIQUE: Multidetector CT imaging of the left ankle was performed according to the standard protocol. Multiplanar CT image reconstructions were also generated. COMPARISON:  Radiographs from earlier on the  same day. FINDINGS: Bones/Joint/Cartilage Acute, closed, trimalleolar fracture of the ankle is noted with intra-articular extension. A comminuted distal fibular diametaphyseal fracture with up to 4 mm of dorsal displacement of the distal fracture fragment noted. Coronal fracture through the posterior malleolus with slight dorsal displacement is also seen up to 4 mm. A transverse fracture through the medial malleolus without displacement is seen extending into the ankle mortise. Intact tibial plafond and talar dome. Intact ankle and subtalar joints. Ligaments Suboptimally assessed by CT. Muscles and Tendons No intramuscular hematoma or muscle atrophy. The tendons crossing the ankle joint appear intact though assessment is somewhat limited due to posttraumatic soft tissue edema along the dorsum of the ankle and foot as well as about the malleoli. Soft tissues Soft tissue swelling with induration about the ankle joint. No focal fluid collection or hematoma. IMPRESSION: Acute, closed, trimalleolar fractures of the left ankle with intra-articular extension into the ankle joint. The distal fibular fracture is comminuted with slight dorsal displacement of the distal fracture fragment up to 4 mm. The posterior malleolar fracture is also dorsally displaced 4 mm. The ankle mortise is not widened appearance. The subtalar joint appears congruent. Electronically Signed   By: Tollie Eth M.D.   On: 06/21/2016 21:36    Procedures  Procedures   Medications Ordered in ED Medications  fentaNYL (SUBLIMAZE) injection 50 mcg (not administered)  fentaNYL (SUBLIMAZE) injection 50 mcg (50 mcg Intravenous Given 06/21/16 2046)  ondansetron (ZOFRAN) injection 4 mg (4 mg Intravenous Given 06/21/16 2046)    Initial Impression / Assessment and Plan / ED Course  I have reviewed the triage vital signs and the nursing notes.  Pertinent labs & imaging results that were available during my care of the patient were reviewed by me and  considered in my medical decision making (see chart for details).     Patient with a trimalleolar fracture. I spoke with Dr. Aundria Rud with [facility] orthopedics who advised to get a CT scan to further assess the fracture. He also advises to place the patient in a posterior splint with a stirrup. This was done. She was advised to be strictly nonweightbearing. She was advised on the importance of keeping the leg elevated above the heart. She was given prescriptions for Percocet and ibuprofen. She was advised to call tomorrow to get an appointment with Dr. Aundria Rud in about one week for recheck and to schedule operative repair. She was given return precautions including instructions on what to look for for possible compartment syndrome although she has no suggestions of this now and no significant swelling to her lower leg.  Final Clinical Impressions(s) / ED Diagnoses   Final diagnoses:  Trimalleolar fracture of ankle, closed, left, initial encounter   New Prescriptions New Prescriptions   IBUPROFEN (ADVIL,MOTRIN) 600 MG TABLET    Take 1 tablet (600 mg total) by mouth every 6 (six) hours as needed.   OXYCODONE-ACETAMINOPHEN (PERCOCET) 5-325 MG TABLET    Take 1-2 tablets by mouth every 4 (four) hours as needed.   I personally performed the services described in this documentation, which was scribed in my presence.  The recorded information has been reviewed and considered.     Rolan Bucco, MD 06/21/16 2200

## 2016-06-21 NOTE — ED Triage Notes (Signed)
Pt reports that she fell today.  Left ankle injury.  Signification ankle swelling.

## 2016-06-25 ENCOUNTER — Other Ambulatory Visit: Payer: Self-pay | Admitting: Orthopedic Surgery

## 2016-06-25 ENCOUNTER — Encounter (HOSPITAL_BASED_OUTPATIENT_CLINIC_OR_DEPARTMENT_OTHER): Payer: Self-pay | Admitting: *Deleted

## 2016-06-26 ENCOUNTER — Encounter (HOSPITAL_BASED_OUTPATIENT_CLINIC_OR_DEPARTMENT_OTHER): Payer: Self-pay | Admitting: *Deleted

## 2016-07-01 NOTE — Anesthesia Preprocedure Evaluation (Addendum)
Anesthesia Evaluation  Patient identified by MRN, date of birth, ID band Patient awake    Reviewed: Allergy & Precautions, NPO status , Patient's Chart, lab work & pertinent test results  Airway Mallampati: II  TM Distance: >3 FB Neck ROM: Full    Dental  (+) Dental Advisory Given   Pulmonary Current Smoker,    breath sounds clear to auscultation       Cardiovascular negative cardio ROS   Rhythm:Regular Rate:Normal     Neuro/Psych negative neurological ROS     GI/Hepatic negative GI ROS, Neg liver ROS,   Endo/Other  negative endocrine ROS  Renal/GU negative Renal ROS     Musculoskeletal   Abdominal   Peds  Hematology negative hematology ROS (+)   Anesthesia Other Findings   Reproductive/Obstetrics                            Anesthesia Physical Anesthesia Plan  ASA: II  Anesthesia Plan: General   Post-op Pain Management:  Regional for Post-op pain   Induction: Intravenous  Airway Management Planned: LMA  Additional Equipment:   Intra-op Plan:   Post-operative Plan: Extubation in OR  Informed Consent: I have reviewed the patients History and Physical, chart, labs and discussed the procedure including the risks, benefits and alternatives for the proposed anesthesia with the patient or authorized representative who has indicated his/her understanding and acceptance.   Dental advisory given  Plan Discussed with:   Anesthesia Plan Comments:        Anesthesia Quick Evaluation

## 2016-07-02 ENCOUNTER — Ambulatory Visit (HOSPITAL_BASED_OUTPATIENT_CLINIC_OR_DEPARTMENT_OTHER)
Admission: RE | Admit: 2016-07-02 | Discharge: 2016-07-02 | Disposition: A | Discharge status: Home / Self Care | Payer: BLUE CROSS/BLUE SHIELD | Source: Ambulatory Visit | Attending: Orthopedic Surgery | Admitting: Orthopedic Surgery

## 2016-07-02 ENCOUNTER — Ambulatory Visit (HOSPITAL_BASED_OUTPATIENT_CLINIC_OR_DEPARTMENT_OTHER): Payer: BLUE CROSS/BLUE SHIELD | Admitting: Anesthesiology

## 2016-07-02 ENCOUNTER — Encounter (HOSPITAL_BASED_OUTPATIENT_CLINIC_OR_DEPARTMENT_OTHER): Payer: Self-pay | Admitting: *Deleted

## 2016-07-02 ENCOUNTER — Encounter (HOSPITAL_BASED_OUTPATIENT_CLINIC_OR_DEPARTMENT_OTHER)
Admission: RE | Disposition: A | Discharge status: Home / Self Care | Payer: Self-pay | Source: Ambulatory Visit | Attending: Orthopedic Surgery

## 2016-07-02 DIAGNOSIS — F1120 Opioid dependence, uncomplicated: Secondary | ICD-10-CM | POA: Insufficient documentation

## 2016-07-02 DIAGNOSIS — W19XXXA Unspecified fall, initial encounter: Secondary | ICD-10-CM | POA: Insufficient documentation

## 2016-07-02 DIAGNOSIS — S82852A Displaced trimalleolar fracture of left lower leg, initial encounter for closed fracture: Secondary | ICD-10-CM | POA: Diagnosis not present

## 2016-07-02 DIAGNOSIS — F17210 Nicotine dependence, cigarettes, uncomplicated: Secondary | ICD-10-CM | POA: Insufficient documentation

## 2016-07-02 DIAGNOSIS — Z79899 Other long term (current) drug therapy: Secondary | ICD-10-CM | POA: Diagnosis not present

## 2016-07-02 HISTORY — PX: ORIF ANKLE FRACTURE: SHX5408

## 2016-07-02 HISTORY — DX: Dorsalgia, unspecified: M54.9

## 2016-07-02 HISTORY — DX: Displaced trimalleolar fracture of left lower leg, initial encounter for closed fracture: S82.852A

## 2016-07-02 HISTORY — DX: Opioid dependence, uncomplicated: F11.20

## 2016-07-02 SURGERY — OPEN REDUCTION INTERNAL FIXATION (ORIF) ANKLE FRACTURE
Anesthesia: General | Site: Ankle | Laterality: Left

## 2016-07-02 MED ORDER — SCOPOLAMINE 1 MG/3DAYS TD PT72
1.0000 | MEDICATED_PATCH | Freq: Once | TRANSDERMAL | Status: DC | PRN
Start: 2016-07-02 — End: 2016-07-02

## 2016-07-02 MED ORDER — 0.9 % SODIUM CHLORIDE (POUR BTL) OPTIME
TOPICAL | Status: DC | PRN
Start: 2016-07-02 — End: 2016-07-02
  Administered 2016-07-02: 250 mL

## 2016-07-02 MED ORDER — LACTATED RINGERS IV SOLN
INTRAVENOUS | Status: DC
  Administered 2016-07-02 (×3): via INTRAVENOUS

## 2016-07-02 MED ORDER — FENTANYL CITRATE (PF) 100 MCG/2ML IJ SOLN
INTRAMUSCULAR | Status: AC
  Filled 2016-07-02: qty 2

## 2016-07-02 MED ORDER — CEFAZOLIN SODIUM-DEXTROSE 2-4 GM/100ML-% IV SOLN
2.0000 g | INTRAVENOUS | Status: AC
  Administered 2016-07-02: 2 g via INTRAVENOUS

## 2016-07-02 MED ORDER — MIDAZOLAM HCL 2 MG/2ML IJ SOLN
INTRAMUSCULAR | Status: AC
  Filled 2016-07-02: qty 2

## 2016-07-02 MED ORDER — ROPIVACAINE HCL 5 MG/ML IJ SOLN
INTRAMUSCULAR | Status: DC | PRN
  Administered 2016-07-02: 20 mL via PERINEURAL

## 2016-07-02 MED ORDER — DOCUSATE SODIUM 100 MG PO CAPS: 100.0000 mg | ORAL_CAPSULE | Freq: Two times a day (BID) | ORAL | 0 refills | Status: AC

## 2016-07-02 MED ORDER — HYDROMORPHONE HCL 1 MG/ML IJ SOLN: 0.2500 mg | INTRAMUSCULAR | Status: DC | PRN

## 2016-07-02 MED ORDER — ONDANSETRON HCL 4 MG/2ML IJ SOLN
INTRAMUSCULAR | Status: DC | PRN
  Administered 2016-07-02: 4 mg via INTRAVENOUS

## 2016-07-02 MED ORDER — DEXAMETHASONE SODIUM PHOSPHATE 10 MG/ML IJ SOLN
INTRAMUSCULAR | Status: DC | PRN
  Administered 2016-07-02: 10 mg via INTRAVENOUS

## 2016-07-02 MED ORDER — SODIUM CHLORIDE 0.9 % IV SOLN: INTRAVENOUS | Status: DC

## 2016-07-02 MED ORDER — SENNA 8.6 MG PO TABS: 2.0000 | ORAL_TABLET | Freq: Two times a day (BID) | ORAL | 0 refills | Status: AC

## 2016-07-02 MED ORDER — BUPIVACAINE-EPINEPHRINE (PF) 0.5% -1:200000 IJ SOLN
INTRAMUSCULAR | Status: DC | PRN
  Administered 2016-07-02: 30 mL via PERINEURAL

## 2016-07-02 MED ORDER — CEFAZOLIN SODIUM-DEXTROSE 2-4 GM/100ML-% IV SOLN
INTRAVENOUS | Status: AC
  Filled 2016-07-02: qty 100

## 2016-07-02 MED ORDER — LIDOCAINE HCL (CARDIAC) 20 MG/ML IV SOLN
INTRAVENOUS | Status: DC | PRN
  Administered 2016-07-02: 30 mg via INTRAVENOUS

## 2016-07-02 MED ORDER — PROPOFOL 10 MG/ML IV BOLUS
INTRAVENOUS | Status: DC | PRN
  Administered 2016-07-02: 200 mg via INTRAVENOUS

## 2016-07-02 MED ORDER — KETOROLAC TROMETHAMINE 30 MG/ML IJ SOLN: 30.0000 mg | Freq: Once | INTRAMUSCULAR | Status: DC | PRN

## 2016-07-02 MED ORDER — MIDAZOLAM HCL 2 MG/2ML IJ SOLN
1.0000 mg | INTRAMUSCULAR | Status: DC | PRN
  Administered 2016-07-02 (×2): 2 mg via INTRAVENOUS

## 2016-07-02 MED ORDER — PROMETHAZINE HCL 25 MG/ML IJ SOLN: 6.2500 mg | INTRAMUSCULAR | Status: DC | PRN

## 2016-07-02 MED ORDER — ENOXAPARIN SODIUM 40 MG/0.4ML ~~LOC~~ SOLN: 40.0000 mg | SUBCUTANEOUS | 0 refills | Status: AC

## 2016-07-02 MED ORDER — FENTANYL CITRATE (PF) 100 MCG/2ML IJ SOLN
50.0000 ug | INTRAMUSCULAR | Status: DC | PRN
  Administered 2016-07-02: 100 ug via INTRAVENOUS

## 2016-07-02 MED ORDER — OXYCODONE HCL 5 MG PO TABS: 5.0000 mg | ORAL_TABLET | ORAL | 0 refills | Status: AC | PRN

## 2016-07-02 MED ORDER — CHLORHEXIDINE GLUCONATE 4 % EX LIQD: 60.0000 mL | Freq: Once | CUTANEOUS | Status: DC

## 2016-07-02 SURGICAL SUPPLY — 84 items
BANDAGE ESMARK 6X9 LF (GAUZE/BANDAGES/DRESSINGS) IMPLANT
BIT DRILL 2.5X2.75 QC CALB (BIT) ×1 IMPLANT
BIT DRILL 2.9X70 QC CALB (BIT) ×1 IMPLANT
BLADE SURG 15 STRL LF DISP TIS (BLADE) ×2 IMPLANT
BLADE SURG 15 STRL SS (BLADE) ×6
BNDG CMPR 9X4 STRL LF SNTH (GAUZE/BANDAGES/DRESSINGS)
BNDG CMPR 9X6 STRL LF SNTH (GAUZE/BANDAGES/DRESSINGS)
BNDG COHESIVE 4X5 TAN STRL (GAUZE/BANDAGES/DRESSINGS) ×2 IMPLANT
BNDG COHESIVE 6X5 TAN STRL LF (GAUZE/BANDAGES/DRESSINGS) ×2 IMPLANT
BNDG ESMARK 4X9 LF (GAUZE/BANDAGES/DRESSINGS) IMPLANT
BNDG ESMARK 6X9 LF (GAUZE/BANDAGES/DRESSINGS)
CANISTER SUCT 1200ML W/VALVE (MISCELLANEOUS) ×2 IMPLANT
CHLORAPREP W/TINT 26ML (MISCELLANEOUS) ×2 IMPLANT
COVER BACK TABLE 60X90IN (DRAPES) ×2 IMPLANT
CUFF TOURNIQUET SINGLE 34IN LL (TOURNIQUET CUFF) ×2 IMPLANT
DECANTER SPIKE VIAL GLASS SM (MISCELLANEOUS) IMPLANT
DRAPE EXTREMITY T 121X128X90 (DRAPE) ×2 IMPLANT
DRAPE OEC MINIVIEW 54X84 (DRAPES) ×2 IMPLANT
DRAPE U-SHAPE 47X51 STRL (DRAPES) ×2 IMPLANT
DRSG MEPITEL 4X7.2 (GAUZE/BANDAGES/DRESSINGS) ×2 IMPLANT
DRSG PAD ABDOMINAL 8X10 ST (GAUZE/BANDAGES/DRESSINGS) ×4 IMPLANT
ELECT REM PT RETURN 9FT ADLT (ELECTROSURGICAL) ×2
ELECTRODE REM PT RTRN 9FT ADLT (ELECTROSURGICAL) ×1 IMPLANT
GAUZE SPONGE 4X4 12PLY STRL (GAUZE/BANDAGES/DRESSINGS) ×2 IMPLANT
GLOVE BIO SURGEON STRL SZ 6.5 (GLOVE) ×1 IMPLANT
GLOVE BIO SURGEON STRL SZ8 (GLOVE) ×2 IMPLANT
GLOVE BIOGEL PI IND STRL 7.0 (GLOVE) IMPLANT
GLOVE BIOGEL PI IND STRL 8 (GLOVE) ×2 IMPLANT
GLOVE BIOGEL PI INDICATOR 7.0 (GLOVE) ×2
GLOVE BIOGEL PI INDICATOR 8 (GLOVE) ×2
GLOVE ECLIPSE 8.0 STRL XLNG CF (GLOVE) ×2 IMPLANT
GOWN STRL REUS W/ TWL LRG LVL3 (GOWN DISPOSABLE) ×1 IMPLANT
GOWN STRL REUS W/ TWL XL LVL3 (GOWN DISPOSABLE) ×2 IMPLANT
GOWN STRL REUS W/TWL LRG LVL3 (GOWN DISPOSABLE) ×2
GOWN STRL REUS W/TWL XL LVL3 (GOWN DISPOSABLE) ×4
K-WIRE ACE 1.6X6 (WIRE) ×4
KWIRE ACE 1.6X6 (WIRE) IMPLANT
NEEDLE HYPO 22GX1.5 SAFETY (NEEDLE) IMPLANT
NS IRRIG 1000ML POUR BTL (IV SOLUTION) ×2 IMPLANT
PACK BASIN DAY SURGERY FS (CUSTOM PROCEDURE TRAY) ×2 IMPLANT
PAD CAST 4YDX4 CTTN HI CHSV (CAST SUPPLIES) ×1 IMPLANT
PADDING CAST ABS 4INX4YD NS (CAST SUPPLIES)
PADDING CAST ABS COTTON 4X4 ST (CAST SUPPLIES) IMPLANT
PADDING CAST COTTON 4X4 STRL (CAST SUPPLIES) ×2
PADDING CAST COTTON 6X4 STRL (CAST SUPPLIES) ×2 IMPLANT
PENCIL BUTTON HOLSTER BLD 10FT (ELECTRODE) ×2 IMPLANT
PLATE LOCK 8H 103 BILAT FIB (Plate) ×1 IMPLANT
PLATE SPIDER 16 (Washer) ×2 IMPLANT
SANITIZER HAND PURELL 535ML FO (MISCELLANEOUS) ×2 IMPLANT
SCREW ACE CAN 4.0 36M (Screw) ×1 IMPLANT
SCREW ACE CAN 4.0 40M (Screw) ×2 IMPLANT
SCREW CORT FT 32X3.5XNONLOCK (Screw) ×1 IMPLANT
SCREW CORTICAL 3.5MM  28MM (Screw) ×1 IMPLANT
SCREW CORTICAL 3.5MM  32MM (Screw) ×1 IMPLANT
SCREW CORTICAL 3.5MM 28MM (Screw) ×1 IMPLANT
SCREW CORTICAL 3.5MM 32MM (Screw) ×1 IMPLANT
SCREW LOCK 3.5X14 DIST TIB (Screw) ×1 IMPLANT
SCREW LOCK CORT STAR 3.5X12 (Screw) ×1 IMPLANT
SCREW LOCK CORT STAR 3.5X18 (Screw) ×1 IMPLANT
SCREW LOW PROFILE 18MMX3.5MM (Screw) ×2 IMPLANT
SCREW NON LOCKING LP 3.5 16MM (Screw) ×2 IMPLANT
SHEET MEDIUM DRAPE 40X70 STRL (DRAPES) ×2 IMPLANT
SLEEVE SCD COMPRESS KNEE MED (MISCELLANEOUS) ×2 IMPLANT
SPLINT FAST PLASTER 5X30 (CAST SUPPLIES) ×20
SPLINT PLASTER CAST FAST 5X30 (CAST SUPPLIES) ×20 IMPLANT
SPONGE LAP 18X18 X RAY DECT (DISPOSABLE) ×2 IMPLANT
STOCKINETTE 6  STRL (DRAPES) ×1
STOCKINETTE 6 STRL (DRAPES) ×1 IMPLANT
SUCTION FRAZIER HANDLE 10FR (MISCELLANEOUS) ×1
SUCTION TUBE FRAZIER 10FR DISP (MISCELLANEOUS) ×1 IMPLANT
SUT ETHILON 3 0 PS 1 (SUTURE) ×2 IMPLANT
SUT FIBERWIRE #2 38 T-5 BLUE (SUTURE)
SUT MNCRL AB 3-0 PS2 18 (SUTURE) IMPLANT
SUT VIC AB 0 SH 27 (SUTURE) IMPLANT
SUT VIC AB 2-0 SH 27 (SUTURE) ×2
SUT VIC AB 2-0 SH 27XBRD (SUTURE) ×1 IMPLANT
SUTURE FIBERWR #2 38 T-5 BLUE (SUTURE) IMPLANT
SYR BULB 3OZ (MISCELLANEOUS) ×2 IMPLANT
SYR CONTROL 10ML LL (SYRINGE) IMPLANT
TOWEL OR 17X24 6PK STRL BLUE (TOWEL DISPOSABLE) ×4 IMPLANT
TUBE CONNECTING 20X1/4 (TUBING) ×2 IMPLANT
UNDERPAD 30X30 (UNDERPADS AND DIAPERS) ×2 IMPLANT
WASHER FLAT ACE (Orthopedic Implant) ×1 IMPLANT
WASHER PLAIN FLAT ACE NS 3PK (Orthopedic Implant) IMPLANT

## 2016-07-02 NOTE — Discharge Instructions (Addendum)
Toni Arthurs, MD [facility]  Please read the following information regarding your care after surgery.  Medications  You only need a prescription for the narcotic pain medicine (ex. oxycodone, Percocet, Norco).  All of the other medicines listed below are available over the counter. X acetominophen (Tylenol) 650 mg every 4-6 hours as you need for minor pain X oxycodone as prescribed for moderate to severe pain X ibuprofen 600 mg that you have at home, as prescribed, as needed for pain.  Narcotic pain medicine (ex. oxycodone, Percocet, Vicodin) will cause constipation.  To prevent this problem, take the following medicines while you are taking any pain medicine. X docusate sodium (Colace) 100 mg twice a day X senna (Senokot) 2 tablets twice a day  X To help prevent blood clots, inject lovenox as prescribed for two weeks after surgery.  You should also get up every hour while you are awake to move around.    Weight Bearing X Do not bear any weight on the operated leg or foot.  Cast / Splint / Dressing X Keep your splint or cast clean and dry.  Dont put anything (coat hanger, pencil, etc) down inside of it.  If it gets damp, use a hair dryer on the cool setting to dry it.  If it gets soaked, call the office to schedule an appointment for a cast change.   After your dressing, cast or splint is removed; you may shower, but do not soak or scrub the wound.  Allow the water to run over it, and then gently pat it dry.  Swelling It is normal for you to have swelling where you had surgery.  To reduce swelling and pain, keep your toes above your nose for at least 3 days after surgery.  It may be necessary to keep your foot or leg elevated for several weeks.  If it hurts, it should be elevated.  Follow Up Call my office at [phone] when you are discharged from the hospital or surgery center to schedule an appointment to be seen two weeks after surgery.  Call my office at [phone]  if you develop a fever >101.5 F, nausea, vomiting, bleeding from the surgical site or severe pain.     Regional Anesthesia Blocks  1. Numbness or the inability to move the "blocked" extremity may last from 3-48 hours after placement. The length of time depends on the medication injected and your individual response to the medication. If the numbness is not going away after 48 hours, call your surgeon.  2. The extremity that is blocked will need to be protected until the numbness is gone and the  Strength has returned. Because you cannot feel it, you will need to take extra care to avoid injury. Because it may be weak, you may have difficulty moving it or using it. You may not know what position it is in without looking at it while the block is in effect.  3. For blocks in the legs and feet, returning to weight bearing and walking needs to be done carefully. You will need to wait until the numbness is entirely gone and the strength has returned. You should be able to move your leg and foot normally before you try and bear weight or walk. You will need someone to be with you when you first try to ensure you do not fall and possibly risk injury.  4. Bruising and tenderness at the needle site are common side effects and will resolve in a few  days.  5. Persistent numbness or new problems with movement should be communicated to the surgeon or the [facility] [phone])/ [facility] [phone]).   Post Anesthesia Home Care Instructions  Activity: Get plenty of rest for the remainder of the day. A responsible individual must stay with you for 24 hours following the procedure.  For the next 24 hours, DO NOT: -Drive a car -Advertising copywriterperate machinery -Drink alcoholic beverages -Take any medication unless instructed by your physician -Make any legal decisions or sign important papers.  Meals: Start with liquid foods such as gelatin or soup. Progress to regular foods as  tolerated. Avoid greasy, spicy, heavy foods. If nausea and/or vomiting occur, drink only clear liquids until the nausea and/or vomiting subsides. Call your physician if vomiting continues.  Special Instructions/Symptoms: Your throat may feel dry or sore from the anesthesia or the breathing tube placed in your throat during surgery. If this causes discomfort, gargle with warm salt water. The discomfort should disappear within 24 hours.  If you had a scopolamine patch placed behind your ear for the management of post- operative nausea and/or vomiting:  1. The medication in the patch is effective for 72 hours, after which it should be removed.  Wrap patch in a tissue and discard in the trash. Wash hands thoroughly with soap and water. 2. You may remove the patch earlier than 72 hours if you experience unpleasant side effects which may include dry mouth, dizziness or visual disturbances. 3. Avoid touching the patch. Wash your hands with soap and water after contact with the patch.

## 2016-07-02 NOTE — Op Note (Signed)
Michele Sexton, Michele Sexton NO.:  [id number]  MEDICAL RECORD NO.:  [id number]  LOCATION:                                 FACILITY:  PHYSICIAN:  Michele Arthurs, MD        DATE OF BIRTH:  07/31/1965  DATE OF PROCEDURE:  07/02/2016 DATE OF DISCHARGE:  07/02/2016                              OPERATIVE REPORT   PREOPERATIVE DIAGNOSIS:  Left ankle trimalleolar fracture.  POSTOPERATIVE DIAGNOSIS:  Left ankle trimalleolar fracture.  PROCEDURE: 1. Open treatment of left ankle trimalleolar fracture with internal     fixation and fixation of the posterior lip. 2. Stress examination of the left ankle under fluoroscopy. 3. AP, mortise, and lateral radiographs of the left ankle.  SURGEON:  Michele Arthurs, MD.  ASSISTANT:  Michele Martinez, PA-C  ANESTHESIA:  General, regional.  ESTIMATED BLOOD LOSS:  Minimal.  TOURNIQUET TIME:  77 minutes 250 mmHg.  COMPLICATIONS:  None apparent.  DISPOSITION:  Extubated awake and stable to recovery.  INDICATIONS FOR PROCEDURE:  The patient is a 51 year old woman who injured her left ankle over a week ago.  X-rays show a left ankle trimalleolar fracture with significant fibular comminution.  She presents today for operative treatment of this injury.  She understands the risks and benefits of the alternative treatment options and elects surgical treatment.  She specifically understands risks of bleeding, infection, nerve damage, blood clots, need for additional surgery, continued pain, nonunion, posttraumatic arthritis, amputation, and death.  PROCEDURE IN DETAIL:  After preoperative consent was obtained and the correct operative site was identified, the patient was brought to the operating room and placed supine on the operating table.  General anesthesia was induced.  Preoperative antibiotics were administered. Surgical time-out was taken.  Left lower extremity was prepped and draped in standard sterile fashion with a tourniquet around the  thigh. The extremity was exsanguinated and tourniquet inflated to 250 mmHg.  A curvilinear incision was made at the posterior lateral aspect of the ankle.  Sharp dissection was carried down through skin and subcutaneous tissue.  Sterile nerve branches were protected throughout the case.  The interval between the peroneals and the flexor hallucis longus was developed.  The posterior malleolus fracture was identified.  It was mobilized with a Joker elevator and irrigated copiously.  It was reduced and held with a pointed tenaculum.  A 4 mm partially threaded cannulated screw was inserted from posterior to anterior compressing the fracture site appropriately.  A 3.5 mm fully-threaded cortical screw was inserted proximally.  This was placed at the apex of the fracture in a buttress configuration with a large spider washer over the apex of the fracture. AP and lateral radiographs confirmed appropriate position of both screws and appropriate reduction of the posterior malleolus fracture.  Dissection was then carried anteriorly to the peroneals where the fibular fracture was identified.  The comminuted fracture fragments were reduced and held with tenaculum.  An 8 hole Biomet ALPS composite plate was contoured to fit the fibula.  It was provisionally pinned. Appropriate alignment was confirmed with x-rays.  Distally, the plate was secured to the comminuted fragments with locking screws. Proximally, the plate was secured  with bicortical nonlocking screws.  A lag screw was inserted from posterior to anterior at the large anterior fragment of the distal fibula.  AP, mortise, and lateral radiographs confirmed appropriate reduction of the fracture.  Attention was then turned to the medial malleolus.  A stab incision was made and a K-wire was inserted across the nondisplaced medial malleolus fracture.  The K-wire was overdrilled and a 4 mm x 40 mm partially threaded screw was inserted.  It was noted  to have excellent purchase and compressed the fracture site appropriately.  Final AP, lateral and mortise radiographs showed appropriate position and length of all hardware and appropriate reduction of the 3 fractures.  Stress examination was then performed.  Dorsiflexion and external rotation stress.  No widening of the ankle mortise or medial clear space was noted.  The wounds were then irrigated copiously.  A 2-0 Vicryl and 3-0 nylon were used to close the incision.  Sterile dressings were applied followed by well-padded short-leg splint.  Tourniquet was released after application of dressings at 77 minutes.  The patient was awakened from anesthesia and transported to the recovery room in stable condition.  FOLLOWUP PLAN:  The patient will be nonweightbearing on the left lower extremity in a short-leg splint.  She will take Lovenox for DVT prophylaxis due to her smoking history.  She will follow up with me in the office in 2 weeks for suture removal and conversion to a short-leg cast.  We will plan nonweightbearing for 6 weeks.  Michele MartinezJustin Ollis, PA-C, was present and scrubbed for the duration of the case.  His assistance was essential in positioning the patient, prepping and draping, gaining and maintaining exposure, performing the operation, closing and dressing the wounds and applying the splint.  RADIOGRAPHS:  AP, mortise and lateral radiographs of the left ankle were obtained intraoperatively.  These show interval reduction and fixation of the medial lateral and posterior malleolus fractures.  Hardware is appropriately positioned and of the appropriate length.  No other acute injuries were noted.     Michele ArthursJohn Kiyomi Pallo, MD   ______________________________ Michele ArthursJohn Bubba Vanbenschoten, MD   JH/MEDQ  D:  07/02/2016  T:  07/02/2016  Job:  [id number]

## 2016-07-02 NOTE — Anesthesia Procedure Notes (Signed)
Anesthesia Regional Block: Adductor canal block   Pre-Anesthetic Checklist: ,, timeout performed, Correct Patient, Correct Site, Correct Laterality, Correct Procedure, Correct Position, site marked, Risks and benefits discussed,  Surgical consent,  Pre-op evaluation,  At surgeon's request and post-op pain management  Laterality: Left  Prep: chloraprep       Needles:  Injection technique: Single-shot  Needle Type: Echogenic Needle     Needle Length: 9cm  Needle Gauge: 21     Additional Needles:   Procedures: ultrasound guided,,,,,,,,  Narrative:  Start time: 07/02/2016 7:10 AM End time: 07/02/2016 7:18 AM Injection made incrementally with aspirations every 5 mL.  Performed by: Personally  Anesthesiologist: Marcene DuosFITZGERALD, Preslea Rhodus

## 2016-07-02 NOTE — Transfer of Care (Signed)
Immediate Anesthesia Transfer of Care Note  Patient: Michele Sexton  Procedure(s) Performed: Procedure(s): OPEN REDUCTION INTERNAL FIXATION (ORIF) LEFT ANKLE TRIMALLEOLAR FRACTURE  (Left)  Patient Location: PACU  Anesthesia Type:GA combined with regional for post-op pain  Level of Consciousness: awake and patient cooperative  Airway & Oxygen Therapy: Patient Spontanous Breathing and Patient connected to face mask oxygen  Post-op Assessment: Report given to RN and Post -op Vital signs reviewed and stable  Post vital signs: Reviewed and stable  Last Vitals:  Vitals:   07/02/16 0727 07/02/16 0728  BP:    Pulse: 71 71  Resp: 14 12  Temp:      Last Pain:  Vitals:   07/02/16 0639  TempSrc: Oral         Complications: No apparent anesthesia complications

## 2016-07-02 NOTE — Op Note (Deleted)
  The note originally documented on this encounter has been moved the the encounter in which it belongs.  

## 2016-07-02 NOTE — Progress Notes (Signed)
Assisted Dr. Rob Fitzgerald with left, ultrasound guided, popliteal, adductor canal block. Side rails up, monitors on throughout procedure. See vital signs in flow sheet. Tolerated Procedure well. 

## 2016-07-02 NOTE — Anesthesia Procedure Notes (Signed)
Anesthesia Regional Block: Popliteal block   Pre-Anesthetic Checklist: ,, timeout performed, Correct Patient, Correct Site, Correct Laterality, Correct Procedure, Correct Position, site marked, Risks and benefits discussed,  Surgical consent,  Pre-op evaluation,  At surgeon's request and post-op pain management  Laterality: Left  Prep: chloraprep       Needles:  Injection technique: Single-shot  Needle Type: Echogenic Needle     Needle Length: 9cm  Needle Gauge: 21     Additional Needles:   Procedures: ultrasound guided,,,,,,,,  Narrative:  Start time: 07/02/2016 7:19 AM End time: 07/02/2016 7:25 AM Injection made incrementally with aspirations every 5 mL.  Performed by: Personally  Anesthesiologist: Marcene DuosFITZGERALD, Ozie Dimaria

## 2016-07-02 NOTE — Anesthesia Postprocedure Evaluation (Signed)
Anesthesia Post Note  Patient: Tilda Francoerri H Manzer  Procedure(s) Performed: Procedure(s) (LRB): OPEN REDUCTION INTERNAL FIXATION (ORIF) LEFT ANKLE TRIMALLEOLAR FRACTURE  (Left)  Patient location during evaluation: PACU Anesthesia Type: General Level of consciousness: awake and alert Pain management: pain level controlled Vital Signs Assessment: post-procedure vital signs reviewed and stable Respiratory status: spontaneous breathing, nonlabored ventilation, respiratory function stable and patient connected to nasal cannula oxygen Cardiovascular status: blood pressure returned to baseline and stable Postop Assessment: no signs of nausea or vomiting Anesthetic complications: no       Last Vitals:  Vitals:   07/02/16 0945 07/02/16 1000  BP: 133/63 (!) 149/66  Pulse: 78 84  Resp: 13 13  Temp:  36.6 C    Last Pain:  Vitals:   07/02/16 1000  TempSrc: Oral  PainSc:                  Kennieth RadFitzgerald, Jazsmin Couse E

## 2016-07-02 NOTE — H&P (Signed)
Michele Sexton is an 51 y.o. female.   Chief Complaint:  Left ankle injjury  HPI:  51 y/o female with PMH of opoid dependence injured her left ankle in a fall last week.  SHe was seen in the ER where xrays revealed a left ankle trimal fracture.  She ;presents today for left ankle ORIF.   Past Medical History:  Diagnosis Date  . Back pain   . Opioid dependence on agonist therapy (HCC)    taking methadone  . Trimalleolar fracture of ankle, closed, left, initial encounter     Past Surgical History:  Procedure Laterality Date  . ANTERIOR CRUCIATE LIGAMENT REPAIR Left 11/30/2007  . CESAREAN SECTION    . SHOULDER SURGERY Right     History reviewed. No pertinent family history. Social History:  reports that she has been smoking Cigarettes.  She has been smoking about 0.50 packs per day. She has never used smokeless tobacco. She reports that she drinks alcohol. She reports that she uses drugs, including Oxycodone.  Allergies: No Known Allergies  Medications Prior to Admission  Medication Sig Dispense Refill  . ibuprofen (ADVIL,MOTRIN) 600 MG tablet Take 1 tablet (600 mg total) by mouth every 6 (six) hours as needed. 30 tablet 0  . methadone (DOLOPHINE) 5 MG tablet Take 40 mg by mouth every 8 (eight) hours.    Marland Kitchen. oxyCODONE-acetaminophen (PERCOCET) 5-325 MG tablet Take 1-2 tablets by mouth every 4 (four) hours as needed. 20 tablet 0    No results found for this or any previous visit (from the past 48 hour(s)). No results found.  ROS  No recent f/c/n/v/wt loss  Blood pressure (!) 143/65, pulse 72, temperature 98.3 F (36.8 C), temperature source Oral, resp. rate (!) 9, height 5\' 4"  (1.626 m), weight 76.2 kg (168 lb 1.6 oz), last menstrual period 06/21/2016, SpO2 98 %. Physical Exam  wn wd woman in nad.  A and O x 4.  Mood and affect normal.  EOMI.  Resp unlabored.  L ankle splinted.  Skin healthy and intact.  No lymphadenopathy.  5/5 strength in PF and DF of the toes.  Sens to LT intact  at the forefoot.  Skin o/w intact.  Assessment/Plan L ankle trimal fracture.  To OR for ORIF.  The risks and benefits of the alternative treatment options have been discussed in detail.  The patient wishes to proceed with surgery and specifically understands risks of bleeding, infection, nerve damage, blood clots, need for additional surgery, amputation and death.   Michele Sexton, Michele Dhondt, MD 07/02/2016, 7:16 AM

## 2016-07-02 NOTE — Anesthesia Procedure Notes (Signed)
Procedure Name: LMA Insertion Date/Time: 07/02/2016 7:36 AM Performed by: Shamarcus Hoheisel D Pre-anesthesia Checklist: Patient identified, Emergency Drugs available, Suction available and Patient being monitored Patient Re-evaluated:Patient Re-evaluated prior to inductionOxygen Delivery Method: Circle system utilized Preoxygenation: Pre-oxygenation with 100% oxygen Intubation Type: IV induction Ventilation: Mask ventilation without difficulty LMA: LMA inserted LMA Size: 3.0 Number of attempts: 1 Airway Equipment and Method: Bite block Placement Confirmation: positive ETCO2 Tube secured with: Tape Dental Injury: Teeth and Oropharynx as per pre-operative assessment

## 2016-07-02 NOTE — Brief Op Note (Signed)
07/02/2016  9:19 AM  PATIENT:  Tilda Francoerri H Patteson  51 y.o. female  PRE-OPERATIVE DIAGNOSIS:  Left Ankle Trimalleolar Fracture   POST-OPERATIVE DIAGNOSIS:  Left Ankle Trimalleolar Fracture   Procedure(s): 1.  Open treatment of left ankle trimal fracture with internal fixation and fixation of the posterior lip 2.  Stress exam of left ankle under fluoro 3.  AP, mortise and lateral xrays of the left ankle  SURGEON:  Toni ArthursJohn Sem Mccaughey, MD  ASSISTANT: Alfredo MartinezJustin Ollis, PA-C  ANESTHESIA:   General, regional  EBL:  minimal   TOURNIQUET:   Total Tourniquet Time Documented: Thigh (Left) - 77 minutes Total: Thigh (Left) - 77 minutes  COMPLICATIONS:  None apparent  DISPOSITION:  Extubated, awake and stable to recovery.  DICTATION ID:  [id number]

## 2016-07-03 ENCOUNTER — Encounter (HOSPITAL_BASED_OUTPATIENT_CLINIC_OR_DEPARTMENT_OTHER): Payer: Self-pay | Admitting: Orthopedic Surgery

## 2019-02-01 IMAGING — DX DG ANKLE COMPLETE 3+V*L*
3 series · 3 of 3 positions shown · non-contrast
Comparison: None.

CLINICAL DATA: Fall, left ankle pain

EXAM:
LEFT ANKLE COMPLETE - 3+ VIEW

[ankle ap]
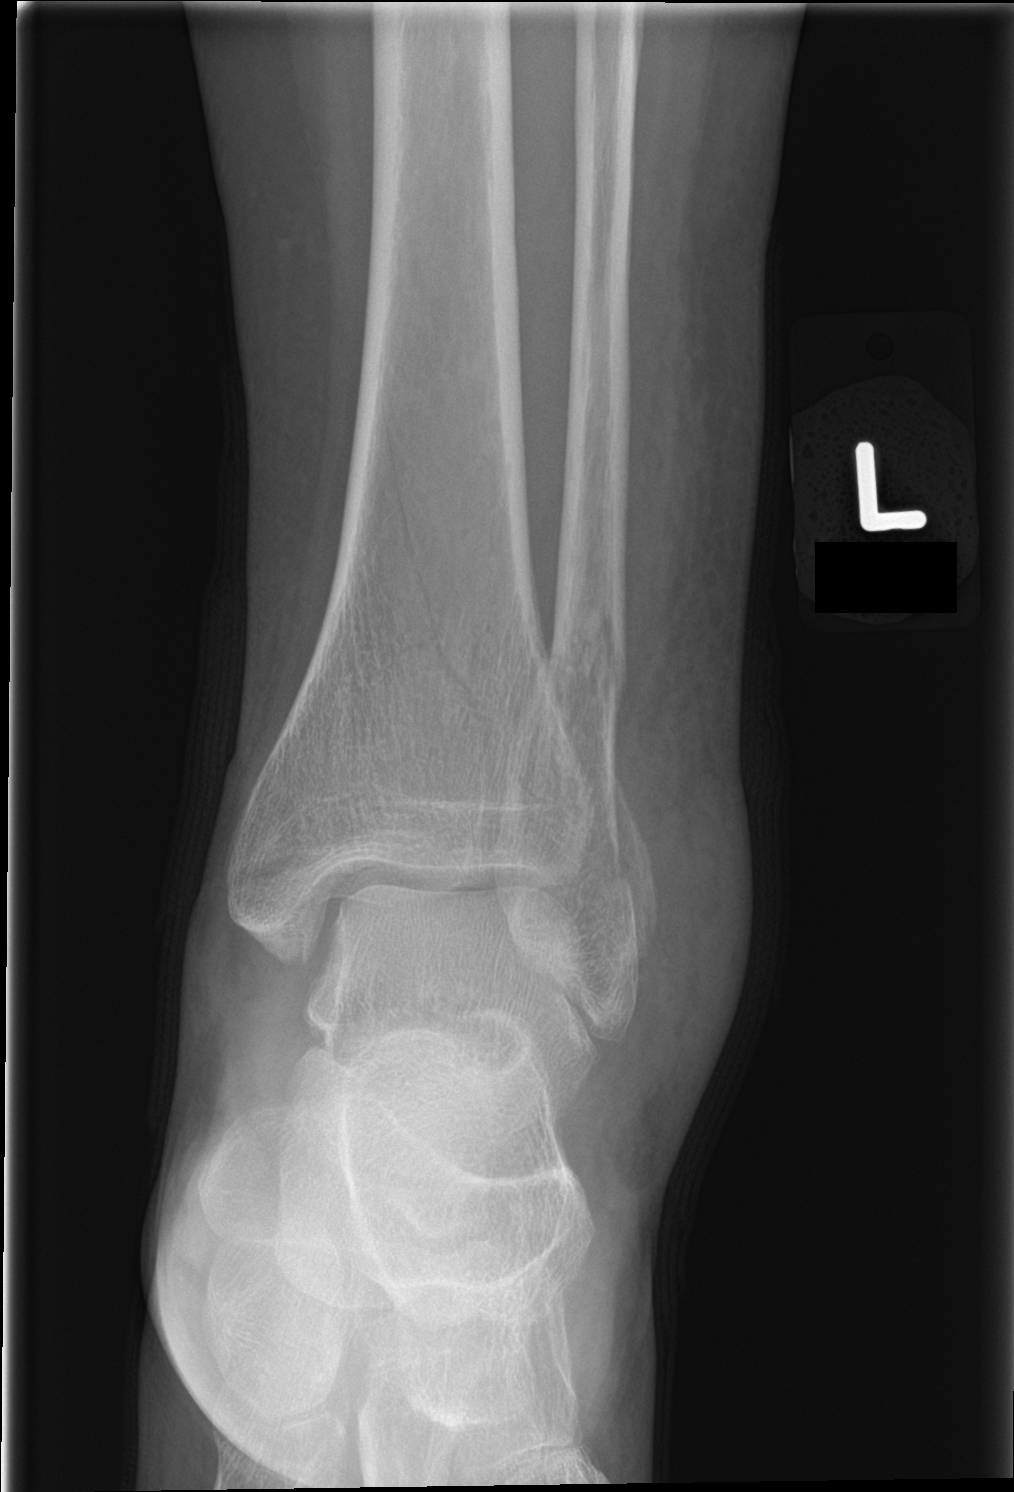

[ankle obl]
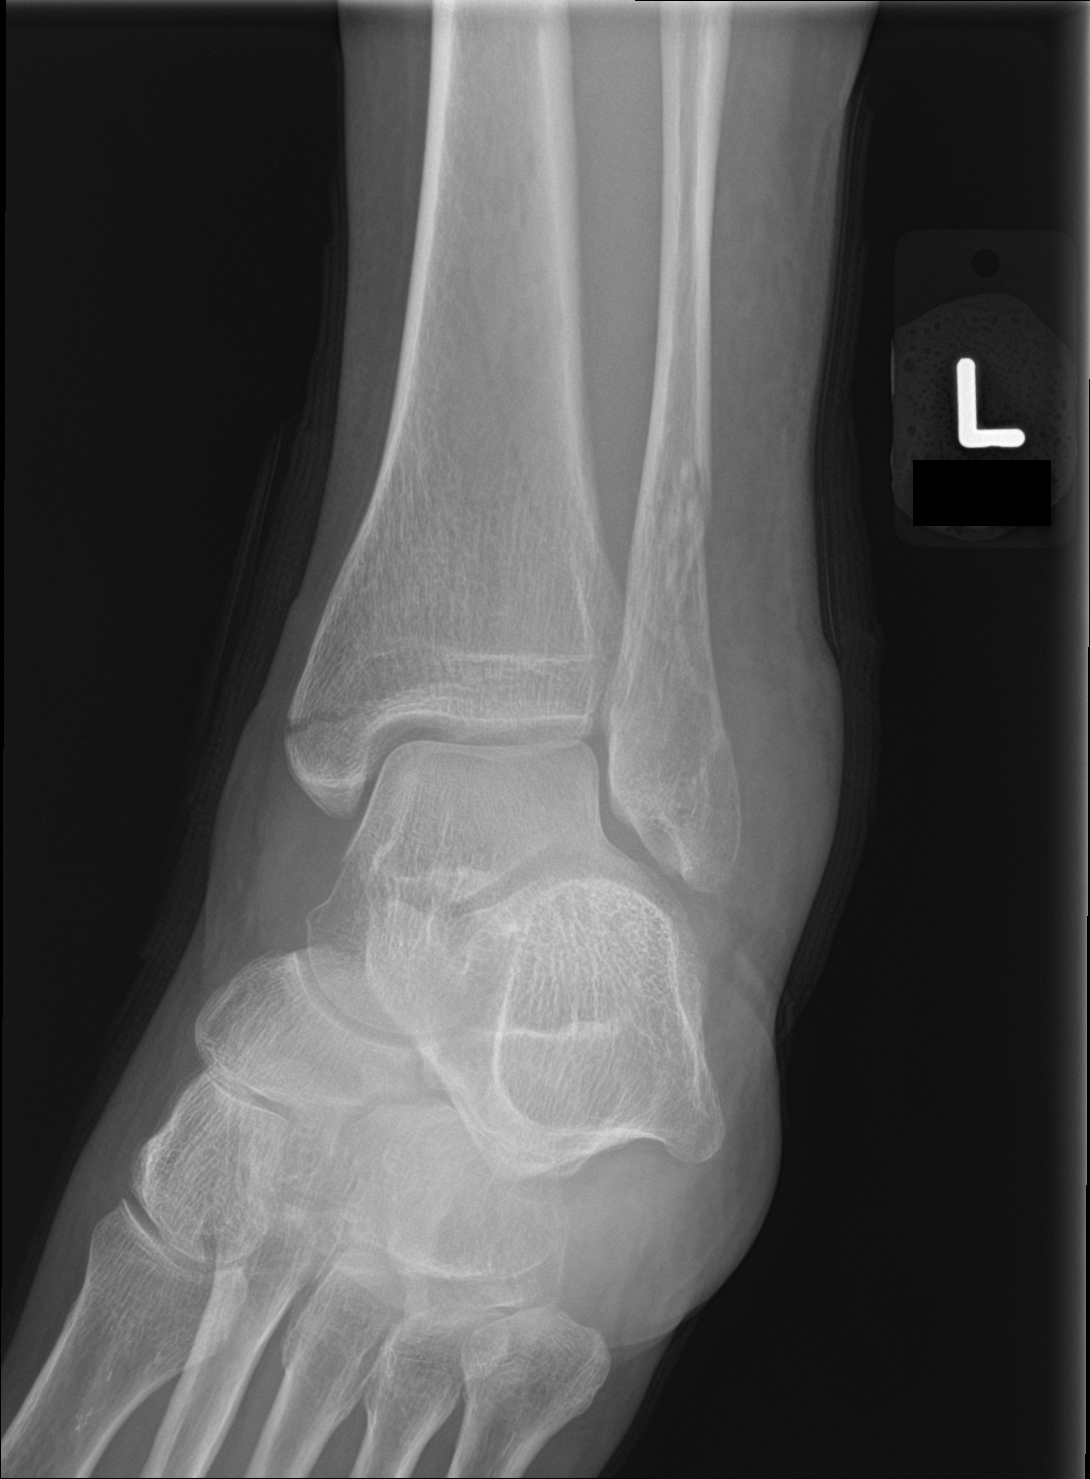

[ankle lat]
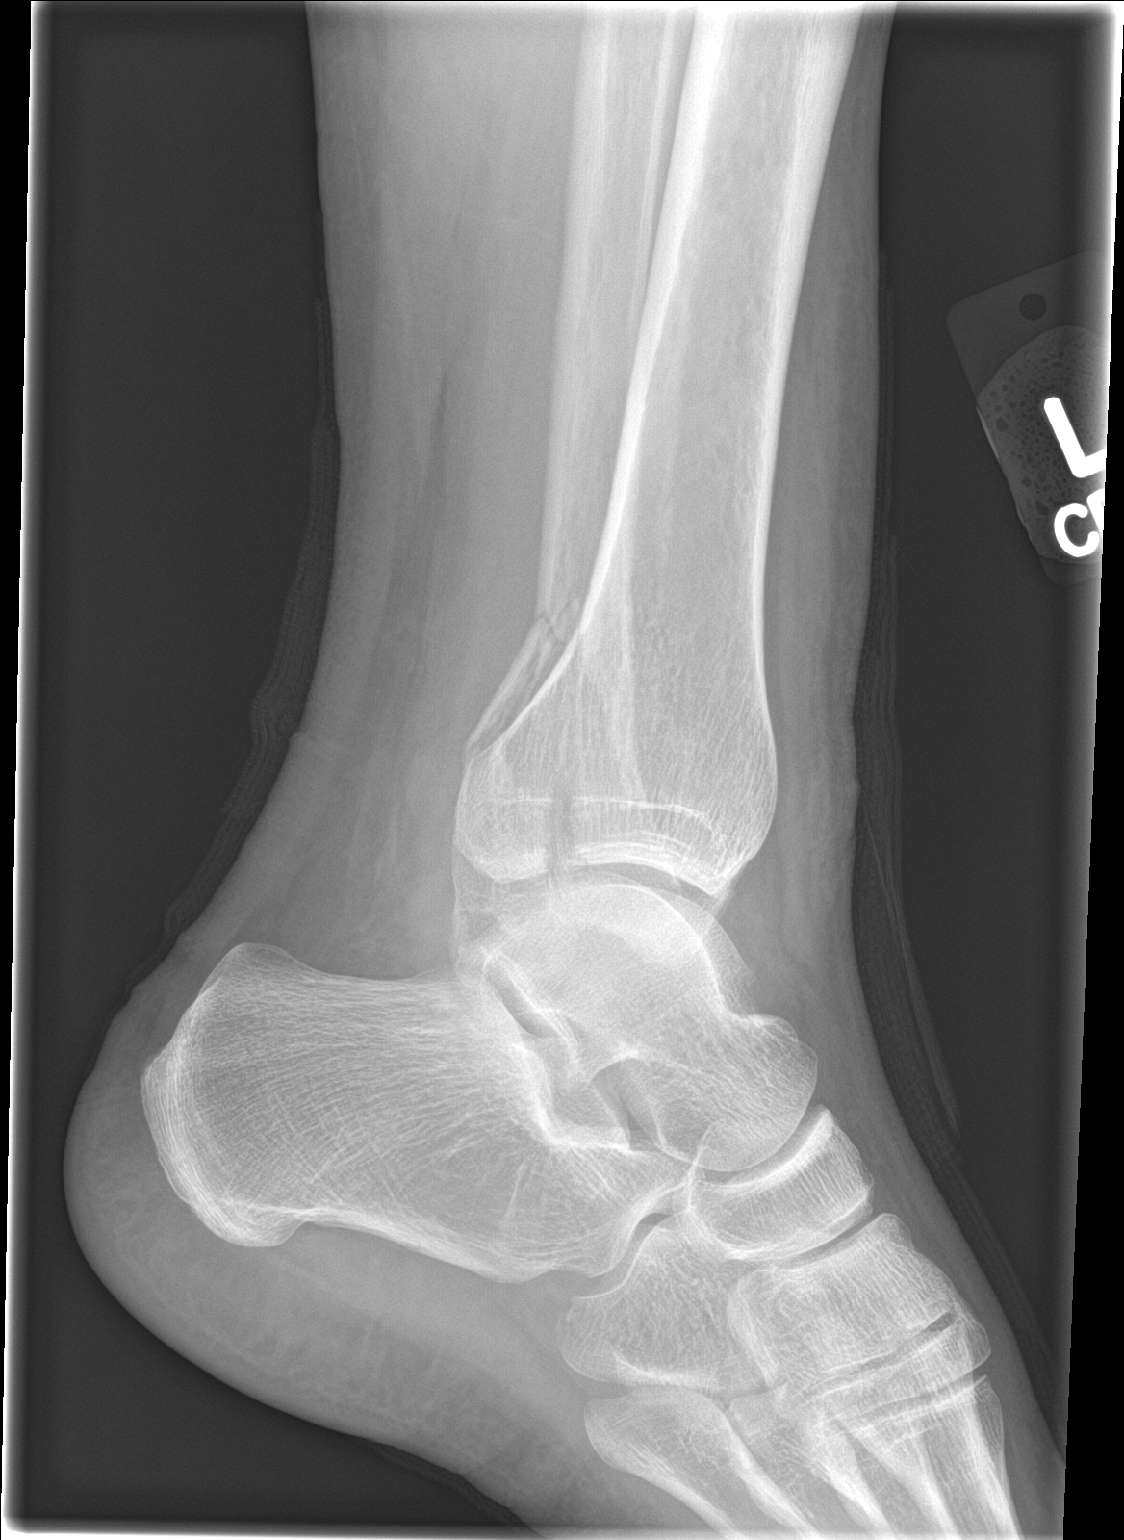

[3 of 3 positions shown; findings below may reference images not displayed]

FINDINGS: Nondisplaced distal fibular fracture, mildly comminuted.

Nondisplaced transverse medial malleolar fracture.

Oblique fracture extending to the medial tibial plafond on the
frontal radiograph, corresponding to a minimally displaced posterior
malleolar fracture on the lateral view.

Ankle mortise is preserved.

Moderate soft tissue swelling, most prominent laterally.
IMPRESSION: Trimalleolar fracture, as above.

## 2021-08-25 ENCOUNTER — Encounter (HOSPITAL_BASED_OUTPATIENT_CLINIC_OR_DEPARTMENT_OTHER): Payer: Self-pay

## 2021-08-25 ENCOUNTER — Emergency Department (HOSPITAL_BASED_OUTPATIENT_CLINIC_OR_DEPARTMENT_OTHER)
Admission: EM | Admit: 2021-08-25 | Discharge: 2021-08-25 | Disposition: A | Discharge status: Home / Self Care | Payer: Commercial Managed Care - PPO | Attending: Emergency Medicine | Admitting: Emergency Medicine

## 2021-08-25 ENCOUNTER — Emergency Department (HOSPITAL_BASED_OUTPATIENT_CLINIC_OR_DEPARTMENT_OTHER): Payer: Commercial Managed Care - PPO | Admitting: Radiology

## 2021-08-25 ENCOUNTER — Other Ambulatory Visit: Payer: Self-pay

## 2021-08-25 DIAGNOSIS — W182XXA Fall in (into) shower or empty bathtub, initial encounter: Secondary | ICD-10-CM | POA: Insufficient documentation

## 2021-08-25 DIAGNOSIS — M25461 Effusion, right knee: Secondary | ICD-10-CM | POA: Insufficient documentation

## 2021-08-25 DIAGNOSIS — M25561 Pain in right knee: Secondary | ICD-10-CM | POA: Diagnosis present

## 2021-08-25 DIAGNOSIS — M25469 Effusion, unspecified knee: Secondary | ICD-10-CM

## 2021-08-25 MED ORDER — HYDROCODONE-ACETAMINOPHEN 5-325 MG PO TABS
1.0000 | ORAL_TABLET | Freq: Once | ORAL | Status: AC
  Administered 2021-08-25: 1 via ORAL
  Filled 2021-08-25: qty 1

## 2021-08-25 MED ORDER — ACETAMINOPHEN 500 MG PO TABS: 500.0000 mg | ORAL_TABLET | Freq: Four times a day (QID) | ORAL | 0 refills | Status: AC | PRN

## 2021-08-25 MED ORDER — IBUPROFEN 600 MG PO TABS: 600.0000 mg | ORAL_TABLET | Freq: Four times a day (QID) | ORAL | 0 refills | Status: AC | PRN

## 2021-08-25 NOTE — ED Provider Notes (Signed)
[facility] EMERGENCY DEPT Provider Note   CSN: [id number] Arrival date & time: 08/25/21  1110     History  Chief Complaint  Patient presents with   Michele Sexton is a 56 y.o. female with a past medical history of osteopenia presenting today after fall.  Patient slipped after getting out of the shower yesterday and injured her right knee.  She is not fully sure exactly what happened but she thinks it buckled under her.  Has been having pain ever since.  Says she feels like she tore something.   Fall       Home Medications Prior to Admission medications   Medication Sig Start Date End Date Taking? Authorizing Provider  acetaminophen (TYLENOL) 500 MG tablet Take 1 tablet (500 mg total) by mouth every 6 (six) hours as needed. 08/25/21  Yes Zabdiel Dripps A, PA-C  docusate sodium (COLACE) 100 MG capsule Take 1 capsule (100 mg total) by mouth 2 (two) times daily. While taking narcotic pain medicine. 07/02/16   Jacinta Shoe, PA-C  enoxaparin (LOVENOX) 40 MG/0.4ML injection Inject 0.4 mLs (40 mg total) into the skin daily. 07/02/16 07/17/16  Jacinta Shoe, PA-C  ibuprofen (ADVIL) 600 MG tablet Take 1 tablet (600 mg total) by mouth every 6 (six) hours as needed. 08/25/21   Ifeoluwa Bartz A, PA-C  methadone (DOLOPHINE) 5 MG tablet Take 40 mg by mouth every 8 (eight) hours.    [provider]  oxyCODONE (ROXICODONE) 5 MG immediate release tablet Take 1-2 tablets (5-10 mg total) by mouth every 4 (four) hours as needed for moderate pain or severe pain. For no more than 3 days. 07/02/16   Jacinta Shoe, PA-C  senna (SENOKOT) 8.6 MG TABS tablet Take 2 tablets (17.2 mg total) by mouth 2 (two) times daily. 07/02/16   Jacinta Shoe, PA-C      Allergies    Patient has no known allergies.    Review of Systems   Review of Systems  Physical Exam Updated Vital Signs BP (!) 161/71 (BP Location: Left Arm)   Pulse 100   Temp 98.4 F (36.9 C)  (Temporal)   Resp 20   Ht 5\' 4"  (1.626 m)   Wt 76.2 kg   SpO2 100%   BMI 28.84 kg/m  Physical Exam Vitals and nursing note reviewed.  Constitutional:      Appearance: Normal appearance.  HENT:     Head: Normocephalic and atraumatic.  Eyes:     General: No scleral icterus.    Conjunctiva/sclera: Conjunctivae normal.  Pulmonary:     Effort: Pulmonary effort is normal. No respiratory distress.  Musculoskeletal:     Comments: Full passive range of motion of the joint.  Strong DP pulse.  No bruising or apparent effusion.  Negative laxity on ligamental exams.  Skin:    Findings: No rash.  Neurological:     Mental Status: She is alert.     Motor: No weakness.  Psychiatric:        Mood and Affect: Mood normal.     ED Results / Procedures / Treatments   Labs (all labs ordered are listed, but only abnormal results are displayed) Labs Reviewed - No data to display  EKG None  Radiology DG Knee Complete 4 Views Right  Result Date: 08/25/2021 CLINICAL DATA:  Right Knee Pain from Fall EXAM: RIGHT KNEE - COMPLETE 4+ VIEW COMPARISON:  None Available. FINDINGS: No evidence of fracture or dislocation. Joint  effusion. No evidence of arthropathy or other focal bone abnormality. Soft tissues are unremarkable. Osteopenia. IMPRESSION: Suprapatellar joint effusion. Osteopenia. No fracture or dislocation. Electronically Signed   By: Marjo Bicker M.D.   On: 08/25/2021 12:00    Procedures Procedures    Medications Ordered in ED Medications  HYDROcodone-acetaminophen (NORCO/VICODIN) 5-325 MG per tablet 1 tablet (1 tablet Oral Given 08/25/21 1251)    ED Course/ Medical Decision Making/ A&P                           Medical Decision Making Amount and/or Complexity of Data Reviewed Radiology: ordered.  Risk OTC drugs. Prescription drug management.   56 year old female presented today after a fall that occurred yesterday.  She has a history of osteopenia and has had a left knee  replacement and bilateral ankle ORIFs with [facility].  Imaging: X-ray ordered, viewed and interpreted by me.  No signs of fracture, small knee effusion.  Physical exam: Unremarkable other than hesitancy to flex/extend secondary to pain.  Neurovascularly intact  Treatment: Given Vicodin, crutches and knee immobilizer  MDM/:disposition: Patient is neurovascularly intact.  No signs of severe limb threatening injury at this time.  Will need to follow-up with orthopedics outpatient if rice therapy fails.  She has been given referrals to our on-call providers however she ultimately may follow-up with her [facility] orthopedist.  She is in agreements.  She will use NSAIDs and Tylenol for pain.  Discharged with her husband with no further questions   Final Clinical Impression(s) / ED Diagnoses Final diagnoses:  Suprapatellar effusion of knee    Rx / DC Orders ED Discharge Orders          Ordered    ibuprofen (ADVIL) 600 MG tablet  Every 6 hours PRN        08/25/21 1242    acetaminophen (TYLENOL) 500 MG tablet  Every 6 hours PRN        08/25/21 1242           Results and diagnoses were explained to the patient. Return precautions discussed in full. Patient had no additional questions and expressed complete understanding.   This chart was dictated using voice recognition software.  Despite best efforts to proofread,  errors can occur which can change the documentation meaning.     Saddie Benders, PA-C 08/25/21 1301    Maia Plan, MD 08/26/21 1003

## 2021-08-25 NOTE — ED Triage Notes (Signed)
Patient here POV from Home.  Patient endorses having Left Knee Surgery Late 2022 and while using the Bathroom Last PM her Left Knee gave way and she landed on her Right Knee.  No LOC. No Head Injury.   NAD Noted during Triage. A&Ox4. GCS 15. BIB Wheelchair.

## 2021-08-25 NOTE — Discharge Instructions (Signed)
There is nothing fractured or dislocated in your knee.  I am unable to see your ligaments and meniscus.  There is a knee effusion, information about this is attached to these papers.  Take ibuprofen 600 mg 4 times a day.  You may also alternate this with 500 mg Tylenol.  If you are taking the ibuprofen, do not take Aleve because they work the same way.  Take these medications with food.  Do your best to rest your knee for the next couple of days.  Whenever you are seated elevate it (toes above nose), ice and keep it compressed.

## 2021-08-25 NOTE — ED Notes (Signed)
Pt states she does not need the crutches, she has some at home. Knee immobilizer placed on pt.

## 2021-09-02 ENCOUNTER — Ambulatory Visit: Payer: Commercial Managed Care - PPO | Admitting: Family

## 2022-05-25 ENCOUNTER — Other Ambulatory Visit: Payer: Self-pay | Admitting: Physician Assistant

## 2022-05-25 DIAGNOSIS — M419 Scoliosis, unspecified: Secondary | ICD-10-CM

## 2022-06-25 ENCOUNTER — Encounter: Payer: Self-pay | Admitting: Physician Assistant

## 2022-06-26 ENCOUNTER — Other Ambulatory Visit: Payer: Commercial Managed Care - PPO

## 2022-07-07 ENCOUNTER — Other Ambulatory Visit: Payer: Commercial Managed Care - PPO

## 2022-07-15 ENCOUNTER — Ambulatory Visit
Admission: RE | Admit: 2022-07-15 | Discharge: 2022-07-15 | Disposition: A | Discharge status: Home / Self Care | Payer: Commercial Managed Care - PPO | Source: Ambulatory Visit | Attending: Physician Assistant | Admitting: Physician Assistant

## 2022-07-15 DIAGNOSIS — M419 Scoliosis, unspecified: Secondary | ICD-10-CM

## 2022-07-15 MED ORDER — GADOPICLENOL 0.5 MMOL/ML IV SOLN
8.0000 mL | Freq: Once | INTRAVENOUS | Status: AC | PRN
  Administered 2022-07-15: 8 mL via INTRAVENOUS

## 2022-11-23 ENCOUNTER — Encounter: Payer: Self-pay | Admitting: Neurology

## 2022-11-23 ENCOUNTER — Telehealth: Payer: Self-pay | Admitting: Neurology

## 2022-11-23 NOTE — Telephone Encounter (Signed)
LVM and sent mychart msg informing pt of need to reschedule 01/05/23 appt - MD out

## 2023-01-05 ENCOUNTER — Ambulatory Visit: Payer: Commercial Managed Care - PPO | Admitting: Neurology

## 2023-02-16 ENCOUNTER — Ambulatory Visit: Payer: Commercial Managed Care - PPO | Admitting: Neurology

## 2023-02-16 ENCOUNTER — Telehealth: Payer: Self-pay | Admitting: Neurology

## 2023-02-16 NOTE — Telephone Encounter (Signed)
 Pt said woke up this morning not feeling good, with a cough. Will call back to reschedule.

## 2023-03-24 ENCOUNTER — Telehealth: Payer: Self-pay | Admitting: Neurology

## 2023-03-24 NOTE — Telephone Encounter (Signed)
Patient ask to be transferred to New Patient Referrals to be assigned to another neurologist with a earlier appointment
